# Patient Record
Sex: Female | Born: 1955 | Race: White | Hispanic: No | Smoking: Former smoker
Health system: Southern US, Community
[De-identification: ages and names within clinical notes are randomized; demographics above are authoritative.]

## PROBLEM LIST (undated history)

## (undated) DIAGNOSIS — E785 Hyperlipidemia, unspecified: Secondary | ICD-10-CM

## (undated) DIAGNOSIS — M1990 Unspecified osteoarthritis, unspecified site: Secondary | ICD-10-CM

## (undated) DIAGNOSIS — J45909 Unspecified asthma, uncomplicated: Secondary | ICD-10-CM

## (undated) DIAGNOSIS — Z8619 Personal history of other infectious and parasitic diseases: Secondary | ICD-10-CM

## (undated) DIAGNOSIS — F329 Major depressive disorder, single episode, unspecified: Secondary | ICD-10-CM

## (undated) DIAGNOSIS — I10 Essential (primary) hypertension: Secondary | ICD-10-CM

## (undated) DIAGNOSIS — K5792 Diverticulitis of intestine, part unspecified, without perforation or abscess without bleeding: Secondary | ICD-10-CM

## (undated) DIAGNOSIS — F32A Depression, unspecified: Secondary | ICD-10-CM

## (undated) DIAGNOSIS — K635 Polyp of colon: Secondary | ICD-10-CM

## (undated) DIAGNOSIS — K760 Fatty (change of) liver, not elsewhere classified: Secondary | ICD-10-CM

## (undated) DIAGNOSIS — E119 Type 2 diabetes mellitus without complications: Secondary | ICD-10-CM

## (undated) DIAGNOSIS — T7840XA Allergy, unspecified, initial encounter: Secondary | ICD-10-CM

## (undated) HISTORY — DX: Type 2 diabetes mellitus without complications: E11.9

## (undated) HISTORY — DX: Major depressive disorder, single episode, unspecified: F32.9

## (undated) HISTORY — DX: Essential (primary) hypertension: I10

## (undated) HISTORY — PX: ABDOMINAL HYSTERECTOMY: SHX81

## (undated) HISTORY — DX: Diverticulitis of intestine, part unspecified, without perforation or abscess without bleeding: K57.92

## (undated) HISTORY — DX: Polyp of colon: K63.5

## (undated) HISTORY — DX: Hyperlipidemia, unspecified: E78.5

## (undated) HISTORY — DX: Unspecified asthma, uncomplicated: J45.909

## (undated) HISTORY — PX: VAGINAL HYSTERECTOMY: SUR661

## (undated) HISTORY — PX: CATARACT EXTRACTION, BILATERAL: SHX1313

## (undated) HISTORY — DX: Depression, unspecified: F32.A

## (undated) HISTORY — PX: VAGINAL PROLAPSE REPAIR: SHX830

## (undated) HISTORY — DX: Unspecified osteoarthritis, unspecified site: M19.90

## (undated) HISTORY — DX: Fatty (change of) liver, not elsewhere classified: K76.0

## (undated) HISTORY — DX: Personal history of other infectious and parasitic diseases: Z86.19

## (undated) HISTORY — DX: Allergy, unspecified, initial encounter: T78.40XA

---

## 2010-01-20 DIAGNOSIS — K5732 Diverticulitis of large intestine without perforation or abscess without bleeding: Secondary | ICD-10-CM | POA: Insufficient documentation

## 2010-01-20 DIAGNOSIS — Z8719 Personal history of other diseases of the digestive system: Secondary | ICD-10-CM | POA: Insufficient documentation

## 2010-02-01 DIAGNOSIS — E669 Obesity, unspecified: Secondary | ICD-10-CM | POA: Insufficient documentation

## 2010-08-11 DIAGNOSIS — F341 Dysthymic disorder: Secondary | ICD-10-CM | POA: Insufficient documentation

## 2010-09-08 DIAGNOSIS — E785 Hyperlipidemia, unspecified: Secondary | ICD-10-CM | POA: Insufficient documentation

## 2013-11-05 DIAGNOSIS — M222X9 Patellofemoral disorders, unspecified knee: Secondary | ICD-10-CM | POA: Insufficient documentation

## 2013-11-05 DIAGNOSIS — M25569 Pain in unspecified knee: Secondary | ICD-10-CM | POA: Insufficient documentation

## 2014-12-01 DIAGNOSIS — J4530 Mild persistent asthma, uncomplicated: Secondary | ICD-10-CM | POA: Insufficient documentation

## 2014-12-01 DIAGNOSIS — Z87891 Personal history of nicotine dependence: Secondary | ICD-10-CM | POA: Insufficient documentation

## 2015-02-24 LAB — HM HEPATITIS C SCREENING LAB: HM Hepatitis Screen: NEGATIVE

## 2015-12-08 DIAGNOSIS — R942 Abnormal results of pulmonary function studies: Secondary | ICD-10-CM | POA: Insufficient documentation

## 2016-05-27 DIAGNOSIS — D171 Benign lipomatous neoplasm of skin and subcutaneous tissue of trunk: Secondary | ICD-10-CM | POA: Insufficient documentation

## 2016-09-02 DIAGNOSIS — Z86010 Personal history of colonic polyps: Secondary | ICD-10-CM | POA: Insufficient documentation

## 2016-12-09 DIAGNOSIS — Z9109 Other allergy status, other than to drugs and biological substances: Secondary | ICD-10-CM | POA: Insufficient documentation

## 2016-12-09 DIAGNOSIS — R0982 Postnasal drip: Secondary | ICD-10-CM | POA: Insufficient documentation

## 2018-03-29 ENCOUNTER — Encounter: Payer: Self-pay | Admitting: Family Medicine

## 2018-05-04 ENCOUNTER — Ambulatory Visit (INDEPENDENT_AMBULATORY_CARE_PROVIDER_SITE_OTHER): Payer: 59 | Admitting: Internal Medicine

## 2018-05-04 ENCOUNTER — Encounter: Payer: Self-pay | Admitting: Internal Medicine

## 2018-05-04 VITALS — BP 132/78 | HR 92 | Temp 97.6°F | Ht 62.0 in | Wt 147.2 lb

## 2018-05-04 DIAGNOSIS — J45909 Unspecified asthma, uncomplicated: Secondary | ICD-10-CM | POA: Diagnosis not present

## 2018-05-04 DIAGNOSIS — Z1231 Encounter for screening mammogram for malignant neoplasm of breast: Secondary | ICD-10-CM | POA: Diagnosis not present

## 2018-05-04 DIAGNOSIS — M1990 Unspecified osteoarthritis, unspecified site: Secondary | ICD-10-CM | POA: Diagnosis not present

## 2018-05-04 DIAGNOSIS — Z23 Encounter for immunization: Secondary | ICD-10-CM | POA: Diagnosis not present

## 2018-05-04 DIAGNOSIS — K5790 Diverticulosis of intestine, part unspecified, without perforation or abscess without bleeding: Secondary | ICD-10-CM | POA: Insufficient documentation

## 2018-05-04 DIAGNOSIS — E559 Vitamin D deficiency, unspecified: Secondary | ICD-10-CM

## 2018-05-04 DIAGNOSIS — F32A Depression, unspecified: Secondary | ICD-10-CM

## 2018-05-04 DIAGNOSIS — I10 Essential (primary) hypertension: Secondary | ICD-10-CM

## 2018-05-04 DIAGNOSIS — K5900 Constipation, unspecified: Secondary | ICD-10-CM | POA: Insufficient documentation

## 2018-05-04 DIAGNOSIS — Z1329 Encounter for screening for other suspected endocrine disorder: Secondary | ICD-10-CM

## 2018-05-04 DIAGNOSIS — E119 Type 2 diabetes mellitus without complications: Secondary | ICD-10-CM | POA: Insufficient documentation

## 2018-05-04 DIAGNOSIS — F329 Major depressive disorder, single episode, unspecified: Secondary | ICD-10-CM

## 2018-05-04 DIAGNOSIS — E1165 Type 2 diabetes mellitus with hyperglycemia: Secondary | ICD-10-CM

## 2018-05-04 NOTE — Progress Notes (Signed)
Pre visit review using our clinic review tool, if applicable. No additional management support is needed unless otherwise documented below in the visit note. 

## 2018-05-04 NOTE — Patient Instructions (Addendum)
Warm prune juice miralax or senna with colace   Tylenol  Tumeric capsules or glucosamine/chroidotin   If you are interested in pneumonia 23 vaccine in 1 month if interested on nurse visit  Also needed shingrix vacccine #2  Tdap vaccine    Constipation, Adult Constipation is when a person has fewer bowel movements in a week than normal, has difficulty having a bowel movement, or has stools that are dry, hard, or larger than normal. Constipation may be caused by an underlying condition. It may become worse with age if a person takes certain medicines and does not take in enough fluids. Follow these instructions at home: Eating and drinking   Eat foods that have a lot of fiber, such as fresh fruits and vegetables, whole grains, and beans.  Limit foods that are high in fat, low in fiber, or overly processed, such as french fries, hamburgers, cookies, candies, and soda.  Drink enough fluid to keep your urine clear or pale yellow. General instructions  Exercise regularly or as told by your health care provider.  Go to the restroom when you have the urge to go. Do not hold it in.  Take over-the-counter and prescription medicines only as told by your health care provider. These include any fiber supplements.  Practice pelvic floor retraining exercises, such as deep breathing while relaxing the lower abdomen and pelvic floor relaxation during bowel movements.  Watch your condition for any changes.  Keep all follow-up visits as told by your health care provider. This is important. Contact a health care provider if:  You have pain that gets worse.  You have a fever.  You do not have a bowel movement after 4 days.  You vomit.  You are not hungry.  You lose weight.  You are bleeding from the anus.  You have thin, pencil-like stools. Get help right away if:  You have a fever and your symptoms suddenly get worse.  You leak stool or have blood in your stool.  Your abdomen is  bloated.  You have severe pain in your abdomen.  You feel dizzy or you faint. This information is not intended to replace advice given to you by your health care provider. Make sure you discuss any questions you have with your health care provider. Document Released: 01/22/2004 Document Revised: 11/13/2015 Document Reviewed: 10/14/2015 Elsevier Interactive Patient Education  2019 [employer].

## 2018-05-04 NOTE — Progress Notes (Addendum)
Chief Complaint  Patient presents with  . Establish Care   NP 1. Asthma controlled on breo 200/25 2. Osteoarthritis hands  3. H/o diverticulitis/osis  4. H/o depression PHQ 9 score 7 on zoloft 100 mg qd  5. H/o DM 2 A1C 7.6 12/21/17 on amaryl 2 mg qd could not tolerate metformin  6. HTN on norvasc 10 mg qd hctz 25 mg losartan 100 mg qd  7. C/o constipation Constipation   Review of Systems  Constitutional: Negative for weight loss.  HENT: Negative for hearing loss.   Eyes: Negative for blurred vision.  Respiratory: Negative for shortness of breath.   Cardiovascular: Negative for chest pain.  Gastrointestinal: Positive for constipation.  Musculoskeletal:       +arthitis hands   Skin: Negative for rash.  Neurological: Negative for headaches.  Psychiatric/Behavioral: Negative for depression. The patient is not nervous/anxious.    Past Medical History:  Diagnosis Date  . Allergy   . Arthritis    hands   . Asthma   . Colon polyps   . Depression   . Diabetes mellitus without complication ([place])   . Diverticulitis   . History of chicken pox   . Hyperlipidemia   . Hypertension    Past Surgical History:  Procedure Laterality Date  . ABDOMINAL HYSTERECTOMY     DUB ovaries intact s/p bladder suspension/sling no h/o abnormal pap    Family History  Problem Relation Age of Onset  . Arthritis Mother   . Depression Mother   . Diabetes Mother   . Heart disease Mother        MI, CHF  . Hyperlipidemia Mother   . Hypertension Mother   . Diabetes Father   . Heart disease Father   . Stroke Father   . Cancer Sister        glioblastoma  . Depression Sister   . Diabetes Sister   . Hyperlipidemia Sister   . Hypertension Sister   . Mental illness Sister   . Diabetes Son        type 1  . Heart disease Maternal Grandmother   . Heart disease Maternal Grandfather    Social History   Socioeconomic History  . Marital status: Divorced    Spouse name: Not on file  . Number of  children: Not on file  . Years of education: Not on file  . Highest education level: Not on file  Occupational History  . Not on file  Social Needs  . Financial resource strain: Not on file  . Food insecurity:    Worry: Not on file    Inability: Not on file  . Transportation needs:    Medical: Not on file    Non-medical: Not on file  Tobacco Use  . Smoking status: Former Research scientist (life sciences)  . Smokeless tobacco: Never Used  . Tobacco comment: quit age 70 y.o duration 12-15 years 1 ppd   Substance and Sexual Activity  . Alcohol use: Yes  . Drug use: Not Currently  . Sexual activity: Not Currently  Lifestyle  . Physical activity:    Days per week: Not on file    Minutes per session: Not on file  . Stress: Not on file  Relationships  . Social connections:    Talks on phone: Not on file    Gets together: Not on file    Attends religious service: Not on file    Active member of club or organization: Not on file    Attends meetings  of clubs or organizations: Not on file    Relationship status: Not on file  . Intimate partner violence:    Fear of current or ex partner: Not on file    Emotionally abused: Not on file    Physically abused: Not on file    Forced sexual activity: Not on file  Other Topics Concern  . Not on file  Social History Narrative   Divorced    1 son    Associates degree, [employer] medical coding educator    Sister lives with her    No guns   Wears seat belt, safe in relationship    Former smoker quit age 84 smoked 12-15 years total 1ppd    Current Meds  Medication Sig  . albuterol (PROVENTIL HFA;VENTOLIN HFA) 108 (90 Base) MCG/ACT inhaler Inhale 2 puffs into the lungs every 6 (six) hours as needed.   Marland Kitchen amLODipine (NORVASC) 10 MG tablet Take by mouth daily.   Marland Kitchen aspirin EC 81 MG tablet Take by mouth daily.   Marland Kitchen atorvastatin (LIPITOR) 20 MG tablet TAKE 1 TABLET ONE TIME DAILY  . ibuprofen (ADVIL,MOTRIN) 200 MG tablet Take by mouth every 6 (six) hours as needed.   Marland Kitchen  losartan (COZAAR) 100 MG tablet Take by mouth daily.   . montelukast (SINGULAIR) 10 MG tablet Take 10 mg by mouth at bedtime.  . sertraline (ZOLOFT) 100 MG tablet TAKE 1 TABLET EVERY DAY  . [DISCONTINUED] fluticasone furoate-vilanterol (BREO ELLIPTA) 200-25 MCG/INH AEPB Inhale 1 puff into the lungs daily.   . [DISCONTINUED] glimepiride (AMARYL) 2 MG tablet Take 2 mg by mouth daily with breakfast.   . [DISCONTINUED] hydrochlorothiazide (HYDRODIURIL) 25 MG tablet Take by mouth daily.   . [DISCONTINUED] potassium chloride (K-DUR) 10 MEQ tablet Take by mouth daily.    Allergies  Allergen Reactions  . Amoxicillin-Pot Clavulanate Diarrhea  . Ciprofloxacin Other (See Comments)    Made her more sick  . Metformin Diarrhea   No results found for this or any previous visit (from the past 2160 hour(s)). Objective  Body mass index is 26.92 kg/m. Wt Readings from Last 3 Encounters:  05/18/18 193 lb (87.5 kg)  05/04/18 147 lb 3.2 oz (66.8 kg)   Temp Readings from Last 3 Encounters:  05/18/18 98.4 F (36.9 C) (Oral)  05/04/18 97.6 F (36.4 C) (Oral)   BP Readings from Last 3 Encounters:  05/18/18 132/78  05/04/18 132/78   Pulse Readings from Last 3 Encounters:  05/18/18 (!) 111  05/04/18 92    Physical Exam Vitals signs and nursing note reviewed.  Constitutional:      Appearance: Normal appearance. She is well-developed and well-groomed.  HENT:     Head: Normocephalic and atraumatic.     Nose: Nose normal.     Mouth/Throat:     Mouth: Mucous membranes are moist.     Pharynx: Oropharynx is clear.  Eyes:     Conjunctiva/sclera: Conjunctivae normal.     Pupils: Pupils are equal, round, and reactive to light.  Cardiovascular:     Rate and Rhythm: Normal rate and regular rhythm.     Heart sounds: Normal heart sounds.  Pulmonary:     Effort: Pulmonary effort is normal.     Breath sounds: Normal breath sounds.  Skin:    General: Skin is warm and dry.  Neurological:     General:  No focal deficit present.     Mental Status: She is alert and oriented to person, place, and time. Mental  status is at baseline.  Psychiatric:        Attention and Perception: Attention and perception normal.        Mood and Affect: Mood and affect normal.        Speech: Speech normal.        Behavior: Behavior normal. Behavior is cooperative.        Thought Content: Thought content normal.        Cognition and Memory: Cognition and memory normal.        Judgment: Judgment normal.     Assessment   1. Asthma controlled  2. Osteoarthritis hands  3. H/o diverticulitis/osis  4. H/o depression PHQ 9 score 7  5. H/o DM 2 A1C 7.6 12/21/17  6. HTN 7. HM 8. Constipation  Plan   1. On Breo daily and prn Albuterol  pfts had 01/15/18  2. Prn Voltaren gel hold for now  Tylenol, Tumeric rec or glucosamine chondroitin  3. H/o diverticulitis  4. Continue zoloft 100 mg qd  5. amaryl 2 mg 1x per day  Disc healthy diet choices and exercise  6. Cont norvac 10 mg qd, hctz 25 mg qd, losartan 100 mg qd  7.  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 Warm prune juice, prn miralax/senna with colace   Pulm [facility] Delma [facility]  [facility] dentist  Former PCP Dr. Arbutus Ped MD 12/21/17 last seen   [facility] mail order refill  [place]   Provider: Dr. Olivia Mackie McLean-Scocuzza-Internal Medicine

## 2018-05-08 ENCOUNTER — Other Ambulatory Visit (INDEPENDENT_AMBULATORY_CARE_PROVIDER_SITE_OTHER): Payer: 59

## 2018-05-08 DIAGNOSIS — Z1329 Encounter for screening for other suspected endocrine disorder: Secondary | ICD-10-CM

## 2018-05-08 DIAGNOSIS — E559 Vitamin D deficiency, unspecified: Secondary | ICD-10-CM | POA: Diagnosis not present

## 2018-05-08 DIAGNOSIS — I10 Essential (primary) hypertension: Secondary | ICD-10-CM

## 2018-05-08 DIAGNOSIS — E1165 Type 2 diabetes mellitus with hyperglycemia: Secondary | ICD-10-CM

## 2018-05-09 LAB — CBC WITH DIFFERENTIAL/PLATELET
Basophils Absolute: 0 10*3/uL (ref 0.0–0.2)
Basos: 1 %
EOS (ABSOLUTE): 0.1 10*3/uL (ref 0.0–0.4)
Eos: 1 %
Hematocrit: 42.5 % (ref 34.0–46.6)
Hemoglobin: 14.9 g/dL (ref 11.1–15.9)
Immature Grans (Abs): 0 10*3/uL (ref 0.0–0.1)
Immature Granulocytes: 0 %
LYMPHS ABS: 2 10*3/uL (ref 0.7–3.1)
Lymphs: 26 %
MCH: 31.2 pg (ref 26.6–33.0)
MCHC: 35.1 g/dL (ref 31.5–35.7)
MCV: 89 fL (ref 79–97)
MONOCYTES: 6 %
Monocytes Absolute: 0.5 10*3/uL (ref 0.1–0.9)
Neutrophils Absolute: 5 10*3/uL (ref 1.4–7.0)
Neutrophils: 66 %
Platelets: 344 10*3/uL (ref 150–450)
RBC: 4.78 x10E6/uL (ref 3.77–5.28)
RDW: 11.7 % — ABNORMAL LOW (ref 12.3–15.4)
WBC: 7.7 10*3/uL (ref 3.4–10.8)

## 2018-05-09 LAB — URINALYSIS, ROUTINE W REFLEX MICROSCOPIC
Bilirubin, UA: NEGATIVE
Glucose, UA: NEGATIVE
Ketones, UA: NEGATIVE
Nitrite, UA: NEGATIVE
Protein, UA: NEGATIVE
SPEC GRAV UA: 1.016 (ref 1.005–1.030)
Urobilinogen, Ur: 0.2 mg/dL (ref 0.2–1.0)
pH, UA: 5.5 (ref 5.0–7.5)

## 2018-05-09 LAB — MICROSCOPIC EXAMINATION
Bacteria, UA: NONE SEEN
CASTS: NONE SEEN /LPF
Epithelial Cells (non renal): NONE SEEN /hpf (ref 0–10)
RBC, UA: NONE SEEN /hpf (ref 0–2)
WBC, UA: NONE SEEN /hpf (ref 0–5)

## 2018-05-09 LAB — TSH: TSH: 1.47 u[IU]/mL (ref 0.450–4.500)

## 2018-05-09 LAB — VITAMIN D 25 HYDROXY (VIT D DEFICIENCY, FRACTURES): VIT D 25 HYDROXY: 11.4 ng/mL — AB (ref 30.0–100.0)

## 2018-05-09 LAB — COMPREHENSIVE METABOLIC PANEL
ALK PHOS: 84 IU/L (ref 39–117)
ALT: 52 IU/L — ABNORMAL HIGH (ref 0–32)
AST: 25 IU/L (ref 0–40)
Albumin/Globulin Ratio: 1.7 (ref 1.2–2.2)
Albumin: 4.6 g/dL (ref 3.6–4.8)
BUN/Creatinine Ratio: 15 (ref 12–28)
BUN: 12 mg/dL (ref 8–27)
Bilirubin Total: 1 mg/dL (ref 0.0–1.2)
CO2: 22 mmol/L (ref 20–29)
Calcium: 9.9 mg/dL (ref 8.7–10.3)
Chloride: 99 mmol/L (ref 96–106)
Creatinine, Ser: 0.78 mg/dL (ref 0.57–1.00)
GFR calc Af Amer: 94 mL/min/{1.73_m2} (ref >59)
GFR calc non Af Amer: 81 mL/min/{1.73_m2} (ref >59)
GLUCOSE: 204 mg/dL — AB (ref 65–99)
Globulin, Total: 2.7 g/dL (ref 1.5–4.5)
Potassium: 3.8 mmol/L (ref 3.5–5.2)
Sodium: 141 mmol/L (ref 134–144)
Total Protein: 7.3 g/dL (ref 6.0–8.5)

## 2018-05-09 LAB — LIPID PANEL
Chol/HDL Ratio: 3 ratio (ref 0.0–4.4)
Cholesterol, Total: 186 mg/dL (ref 100–199)
HDL: 61 mg/dL (ref >39)
LDL Calculated: 93 mg/dL (ref 0–99)
Triglycerides: 161 mg/dL — ABNORMAL HIGH (ref 0–149)
VLDL Cholesterol Cal: 32 mg/dL (ref 5–40)

## 2018-05-09 LAB — HEMOGLOBIN A1C
Est. average glucose Bld gHb Est-mCnc: 186 mg/dL
Hgb A1c MFr Bld: 8.1 % — ABNORMAL HIGH (ref 4.8–5.6)

## 2018-05-09 LAB — MICROALBUMIN / CREATININE URINE RATIO
Creatinine, Urine: 141 mg/dL
MICROALB/CREAT RATIO: 10.6 mg/g{creat} (ref 0.0–30.0)
Microalbumin, Urine: 14.9 ug/mL

## 2018-05-11 ENCOUNTER — Encounter: Payer: Self-pay | Admitting: Internal Medicine

## 2018-05-11 ENCOUNTER — Other Ambulatory Visit: Payer: Self-pay | Admitting: Internal Medicine

## 2018-05-11 DIAGNOSIS — E559 Vitamin D deficiency, unspecified: Secondary | ICD-10-CM

## 2018-05-11 MED ORDER — CHOLECALCIFEROL 1.25 MG (50000 UT) PO CAPS: 50000.0000 [IU] | ORAL_CAPSULE | ORAL | 1 refills | Status: DC

## 2018-05-15 ENCOUNTER — Telehealth: Payer: Self-pay

## 2018-05-15 NOTE — Telephone Encounter (Signed)
Copied from [place] [id number]. Topic: Appointment Scheduling - Scheduling Inquiry for Clinic >> May 15, 2018 10:18 AM Jessica Snyder, NT wrote: Reason for CRM: patient is wanting to schedule her 2nd shingle shot.

## 2018-05-15 NOTE — Telephone Encounter (Signed)
Shot scheduled [id number]

## 2018-05-16 ENCOUNTER — Other Ambulatory Visit: Payer: Self-pay | Admitting: Internal Medicine

## 2018-05-16 ENCOUNTER — Ambulatory Visit (INDEPENDENT_AMBULATORY_CARE_PROVIDER_SITE_OTHER): Payer: 59 | Admitting: *Deleted

## 2018-05-16 DIAGNOSIS — R7989 Other specified abnormal findings of blood chemistry: Secondary | ICD-10-CM

## 2018-05-16 DIAGNOSIS — R945 Abnormal results of liver function studies: Principal | ICD-10-CM

## 2018-05-16 DIAGNOSIS — Z23 Encounter for immunization: Secondary | ICD-10-CM | POA: Diagnosis not present

## 2018-05-18 ENCOUNTER — Ambulatory Visit (INDEPENDENT_AMBULATORY_CARE_PROVIDER_SITE_OTHER): Payer: 59 | Admitting: Family Medicine

## 2018-05-18 ENCOUNTER — Ambulatory Visit (INDEPENDENT_AMBULATORY_CARE_PROVIDER_SITE_OTHER)
Admission: RE | Admit: 2018-05-18 | Discharge: 2018-05-18 | Disposition: A | Discharge status: Home / Self Care | Payer: 59 | Source: Ambulatory Visit | Attending: Family Medicine | Admitting: Family Medicine

## 2018-05-18 ENCOUNTER — Encounter: Payer: Self-pay | Admitting: Family Medicine

## 2018-05-18 ENCOUNTER — Ambulatory Visit: Payer: Self-pay

## 2018-05-18 ENCOUNTER — Telehealth: Payer: Self-pay | Admitting: *Deleted

## 2018-05-18 VITALS — BP 132/78 | HR 111 | Temp 98.4°F | Ht 62.0 in | Wt 193.0 lb

## 2018-05-18 DIAGNOSIS — R1030 Lower abdominal pain, unspecified: Secondary | ICD-10-CM

## 2018-05-18 LAB — CBC WITH DIFFERENTIAL/PLATELET
Basophils Absolute: 0 10*3/uL (ref 0.0–0.2)
Basos: 0 %
EOS (ABSOLUTE): 0 10*3/uL (ref 0.0–0.4)
Eos: 0 %
Hematocrit: 41 % (ref 34.0–46.6)
Hemoglobin: 14.3 g/dL (ref 11.1–15.9)
Immature Grans (Abs): 0 10*3/uL (ref 0.0–0.1)
Immature Granulocytes: 0 %
Lymphocytes Absolute: 2.4 10*3/uL (ref 0.7–3.1)
Lymphs: 21 %
MCH: 31.3 pg (ref 26.6–33.0)
MCHC: 34.9 g/dL (ref 31.5–35.7)
MCV: 90 fL (ref 79–97)
Monocytes Absolute: 1 10*3/uL — ABNORMAL HIGH (ref 0.1–0.9)
Monocytes: 9 %
Neutrophils Absolute: 8 10*3/uL — ABNORMAL HIGH (ref 1.4–7.0)
Neutrophils: 70 %
Platelets: 338 10*3/uL (ref 150–450)
RBC: 4.57 x10E6/uL (ref 3.77–5.28)
RDW: 12 % (ref 11.7–15.4)
WBC: 11.5 10*3/uL — ABNORMAL HIGH (ref 3.4–10.8)

## 2018-05-18 LAB — POC URINALSYSI DIPSTICK (AUTOMATED)
Bilirubin, UA: NEGATIVE
Blood, UA: NEGATIVE
GLUCOSE UA: NEGATIVE
Ketones, UA: NEGATIVE
Leukocytes, UA: NEGATIVE
Nitrite, UA: NEGATIVE
Protein, UA: NEGATIVE
Spec Grav, UA: 1.01 (ref 1.010–1.025)
UROBILINOGEN UA: 0.2 U/dL
pH, UA: 7 (ref 5.0–8.0)

## 2018-05-18 LAB — COMPREHENSIVE METABOLIC PANEL
ALBUMIN: 4.4 g/dL (ref 3.6–4.8)
ALT: 49 IU/L — ABNORMAL HIGH (ref 0–32)
AST: 32 IU/L (ref 0–40)
Albumin/Globulin Ratio: 1.5 (ref 1.2–2.2)
Alkaline Phosphatase: 88 IU/L (ref 39–117)
BUN / CREAT RATIO: 13 (ref 12–28)
BUN: 10 mg/dL (ref 8–27)
Bilirubin Total: 1.3 mg/dL — ABNORMAL HIGH (ref 0.0–1.2)
CO2: 24 mmol/L (ref 20–29)
Calcium: 10 mg/dL (ref 8.7–10.3)
Chloride: 95 mmol/L — ABNORMAL LOW (ref 96–106)
Creatinine, Ser: 0.79 mg/dL (ref 0.57–1.00)
GFR calc Af Amer: 92 mL/min/{1.73_m2} (ref >59)
GFR calc non Af Amer: 80 mL/min/{1.73_m2} (ref >59)
GLUCOSE: 174 mg/dL — AB (ref 65–99)
Globulin, Total: 2.9 g/dL (ref 1.5–4.5)
Potassium: 3.6 mmol/L (ref 3.5–5.2)
Sodium: 137 mmol/L (ref 134–144)
Total Protein: 7.3 g/dL (ref 6.0–8.5)

## 2018-05-18 LAB — LIPASE: Lipase: 21 U/L (ref 14–72)

## 2018-05-18 MED ORDER — METRONIDAZOLE 500 MG PO TABS: 500.0000 mg | ORAL_TABLET | Freq: Three times a day (TID) | ORAL | 0 refills | Status: AC

## 2018-05-18 MED ORDER — SULFAMETHOXAZOLE-TRIMETHOPRIM 800-160 MG PO TABS: 1.0000 | ORAL_TABLET | Freq: Two times a day (BID) | ORAL | 0 refills | Status: AC

## 2018-05-18 NOTE — Telephone Encounter (Signed)
Pt c/o sudden onset of severe abdominal pain that woke her up in the early morning hours. Pt stated that her pain is moderate. Pt has been constipated and having mild nausea. Pt stated this morning her pain is mild and intermittent. Sitting down makes it worse but then decreases. No vomiting or fever or increased abdominal distension. Pt stated pain is similar to her previous episode of diverticulitis 6 months ago.   Care advice given and pt verbalized understanding.  No appts available at [facility]. Pt given an appointment with Dr Danise Mina at [facility] at noon today.   Reason for Disposition . Age > 60 years  Answer Assessment - Initial Assessment Questions 1. LOCATION: "Where does it hurt?"      Across entire abdomen mid to lower abdomen 2. RADIATION: "Does the pain shoot anywhere else?" (e.g., chest, back)     back 3. ONSET: "When did the pain begin?" (e.g., minutes, hours or days ago)      Middle of night woke her up 4. SUDDEN: "Gradual or sudden onset?"     sudden 5. PATTERN "Does the pain come and go, or is it constant?"    - If constant: "Is it getting better, staying the same, or worsening?"      (Note: Constant means the pain never goes away completely; most serious pain is constant and it progresses)     - If intermittent: "How long does it last?" "Do you have pain now?"     (Note: Intermittent means the pain goes away completely between bouts)     Comes and goes -1 minute- only when sits down but goes away but back still hurts 6. SEVERITY: "How bad is the pain?"  (e.g., Scale 1-10; mild, moderate, or severe)   - MILD (1-3): doesn't interfere with normal activities, abdomen soft and not tender to touch    - MODERATE (4-7): interferes with normal activities or awakens from sleep, tender to touch    - SEVERE (8-10): excruciating pain, doubled over, unable to do any normal activities      moderate 7. RECURRENT SYMPTOM: "Have you ever had this type of abdominal pain before?" If  so, ask: "When was the last time?" and "What happened that time?"      Yes not this severe-6 months ago abx for diverticulitis 8. CAUSE: "What do you think is causing the abdominal pain?"     Diverticulitis but not sure 9. RELIEVING/AGGRAVATING FACTORS: "What makes it better or worse?" (e.g., movement, antacids, bowel movement)     Worse: sitting down, when trying to go to the bathroom 10. OTHER SYMPTOMS: "Has there been any vomiting, diarrhea, constipation, or urine problems?"       Constipation, nausea 11. PREGNANCY: "Is there any chance you are pregnant?" "When was your last menstrual period?"       n/a  Protocols used: ABDOMINAL PAIN - [facility]

## 2018-05-18 NOTE — Progress Notes (Signed)
BP 132/78 (BP Location: Left Arm, Patient Position: Sitting, Cuff Size: Large)   Pulse (!) 111   Temp 98.4 F (36.9 C) (Oral)   Ht 5\' 2"  (1.575 m)   Wt 193 lb (87.5 kg)   SpO2 97%   BMI 35.30 kg/m    CC: abd pain, back pain Subjective:    Patient ID: Jessica Snyder, female    DOB: March 25, 1956, 63 y.o.   MRN: [id number]  HPI: Jessica Snyder is a 63 y.o. female presenting on 05/18/2018 for Abdominal Pain (C/o low abd pain that started yesterday morning. Had shingles vaccine 05/16/18, not sure if related. Denies any n/v/d. Pain occurs when sitting. Has not had complete BM in awhile. Says she is due to have [place] abd due to pain. H/o diverticulitis. ) and Back Pain (C/o low back pain. )   Intermittent lower abdominal "twinges" for the past week, acute worsening last night of severe suprapubic abdominal pain (now resolved), persistent lower abdominal discomfort worse when sitting down. More constipated over the last 2 days. Passing gas ok. She felt feverish yesterday (see below). Feels more thirsty. Appetite decreased. Mild nausea.   No vomiting, no blood in stool or urine.   Known h/o diverticulitis latest episode 11/2017 treated with . Unsure what she took, but states she thinks cipro/flagyl and/or augmentin has made her ill. Reviewing CareEverywhere, she received treatment for diverticulitis at [facility] 11/2017 with bactrim and flagyl 10d course.   Newly established with Dr Aundra Dubin. Latest note reviewed.   Had 2nd shingrix 05/16/2018, felt feverish, chills, nauseated - stayed in bed all day.   Upcoming abd [place] next week for mild transaminitis Last colonoscopy in the past 1-2 yrs with diverticulosis. Notes mention h/o large diverticuli in sigmoid colon.      Relevant past medical, surgical, family and social history reviewed and updated as indicated. Interim medical history since our last visit reviewed. Allergies and medications reviewed and updated. Outpatient Medications Prior to Visit  Medication  Sig Dispense Refill  . albuterol (PROVENTIL HFA;VENTOLIN HFA) 108 (90 Base) MCG/ACT inhaler Inhale 2 puffs into the lungs every 6 (six) hours as needed.     Marland Kitchen amLODipine (NORVASC) 10 MG tablet Take by mouth daily.     Marland Kitchen aspirin EC 81 MG tablet Take by mouth daily.     Marland Kitchen atorvastatin (LIPITOR) 20 MG tablet TAKE 1 TABLET ONE TIME DAILY    . Cholecalciferol 1.25 MG (50000 UT) capsule Take 1 capsule (50,000 Units total) by mouth once a week. 13 capsule 1  . fluticasone furoate-vilanterol (BREO ELLIPTA) 200-25 MCG/INH AEPB Inhale 1 puff into the lungs daily.     Marland Kitchen glimepiride (AMARYL) 2 MG tablet Take 2 mg by mouth daily with breakfast.     . hydrochlorothiazide (HYDRODIURIL) 25 MG tablet Take by mouth daily.     Marland Kitchen ibuprofen (ADVIL,MOTRIN) 200 MG tablet Take by mouth every 6 (six) hours as needed.     Marland Kitchen losartan (COZAAR) 100 MG tablet Take by mouth daily.     . montelukast (SINGULAIR) 10 MG tablet Take 10 mg by mouth at bedtime.    . potassium chloride (K-DUR) 10 MEQ tablet Take by mouth daily.     . sertraline (ZOLOFT) 100 MG tablet TAKE 1 TABLET EVERY DAY     No facility-administered medications prior to visit.      Per HPI unless specifically indicated in ROS section below Review of Systems Objective:    BP 132/78 (BP Location: Left Arm, Patient  Position: Sitting, Cuff Size: Large)   Pulse (!) 111   Temp 98.4 F (36.9 C) (Oral)   Ht 5\' 2"  (1.575 m)   Wt 193 lb (87.5 kg)   SpO2 97%   BMI 35.30 kg/m   Wt Readings from Last 3 Encounters:  05/18/18 193 lb (87.5 kg)  05/04/18 147 lb 3.2 oz (66.8 kg)    Physical Exam Vitals signs and nursing note reviewed.  Constitutional:      General: She is not in acute distress.    Appearance: Normal appearance.  HENT:     Head: Normocephalic and atraumatic.     Mouth/Throat:     Mouth: Mucous membranes are dry.     Comments: Somewhat dry mm Eyes:     Extraocular Movements: Extraocular movements intact.     Pupils: Pupils are equal, round,  and reactive to light.  Cardiovascular:     Rate and Rhythm: Normal rate and regular rhythm.     Pulses: Normal pulses.     Heart sounds: Normal heart sounds. No murmur.  Pulmonary:     Effort: Pulmonary effort is normal. No respiratory distress.     Breath sounds: Normal breath sounds. No wheezing, rhonchi or rales.  Abdominal:     General: Abdomen is flat. Bowel sounds are normal. There is no distension.     Palpations: Abdomen is soft. There is no mass.     Tenderness: There is abdominal tenderness (mild-mod) in the suprapubic area and left lower quadrant. There is no right CVA tenderness, left CVA tenderness, guarding or rebound. Negative signs include Murphy's sign.     Hernia: No hernia is present.     Comments: Bowel sounds normal to decreased  Musculoskeletal:     Right lower leg: No edema.     Left lower leg: No edema.  Skin:    Findings: No rash.  Neurological:     Mental Status: She is alert.  Psychiatric:        Mood and Affect: Mood normal.       Results for orders placed or performed in visit on 05/18/18  POCT Urinalysis Dipstick (Automated)  Result Value Ref Range   Color, UA yellow    Clarity, UA clear    Glucose, UA Negative Negative   Bilirubin, UA negative    Ketones, UA negative    Spec Grav, UA 1.010 1.010 - 1.025   Blood, UA negative    pH, UA 7.0 5.0 - 8.0   Protein, UA Negative Negative   Urobilinogen, UA 0.2 0.2 or 1.0 E.U./dL   Nitrite, UA negative    Leukocytes, UA Negative Negative   Assessment & Plan:   Problem List Items Addressed This Visit    Lower abdominal pain - Primary    Left and mid lower abdominal pain with constipation and fevers/chills 2 days ago, in h/o diverticulitis - anticipate flare of same. Check 2 view abd r/o obstruction. Check labs (CBC, CMP, lipase). Treat with bactrim DS/flagyl 10d course (pt endorses cipro and augmentin intolerances) and low fiber bland diet over next 3-5 days.  Red flags to seek ER care over weekend  reviewed.  She was tachycardic but was able to tolerate glass of water. Encouraged small sips of fluid throughout the day.  Rec f/u with PCP next week for recheck.  Discussed possible surgical eval given recurrent episodes.      Relevant Orders   POCT Urinalysis Dipstick (Automated) (Completed)   DG Abd 2 Views  Comprehensive metabolic panel   Lipase   CBC with Differential       Meds ordered this encounter  Medications  . sulfamethoxazole-trimethoprim (BACTRIM DS,SEPTRA DS) 800-160 MG tablet    Sig: Take 1 tablet by mouth 2 (two) times daily for 10 days.    Dispense:  20 tablet    Refill:  0  . metroNIDAZOLE (FLAGYL) 500 MG tablet    Sig: Take 1 tablet (500 mg total) by mouth 3 (three) times daily for 10 days.    Dispense:  30 tablet    Refill:  0   Orders Placed This Encounter  Procedures  . DG Abd 2 Views    Standing Status:   Future    Number of Occurrences:   1    Standing Expiration Date:   07/17/2019    Order Specific Question:   Reason for Exam (SYMPTOM  OR DIAGNOSIS REQUIRED)    Answer:   lower abd pain, constipation r/o obstruction    Order Specific Question:   Preferred imaging location?    Answer:   [facility]    Order Specific Question:   Radiology Contrast Protocol - do NOT remove file path    Answer:   [email or web address]  . Comprehensive metabolic panel  . Lipase  . CBC with Differential  . POCT Urinalysis Dipstick (Automated)    Patient Instructions  I do think you have diverticulitis flare - treat with antibiotics sent to pharmacy. Low fiber and bland diet over the next several days.  Xray and labs today. If fever >101, worsening abdominal pain, vomiting, please seek urgent care at ER over weekend.  Follow up with Dr Aundra Dubin next week.    Follow up plan: Return in about 5 days (around 05/23/2018), or if symptoms worsen or fail to improve, for follow up visit.  Ria Bush, MD

## 2018-05-18 NOTE — Patient Instructions (Addendum)
I do think you have diverticulitis flare - treat with antibiotics sent to pharmacy. Low fiber and bland diet over the next several days.  Xray and labs today. If fever >101, worsening abdominal pain, vomiting, please seek urgent care at ER over weekend.  Follow up with Dr Aundra Dubin next week.

## 2018-05-18 NOTE — Telephone Encounter (Signed)
Sch US abdomen asap   Tell pt people feel sick after shingles shot if not better consider urgent care this could be reaction for shingles shot   [place]

## 2018-05-18 NOTE — Telephone Encounter (Signed)
Copied from [place] [id number]. Topic: General - Other >> May 18, 2018  8:24 AM Jessica Snyder wrote: Reason for CRM: Pt called stating she was suppose to have the ultra sound of the abdomin. She hasn't scheduled yet. She is experiencing intermittent abdominal pain that started late yesterday. Pt wants to know if she should go to the ER. Pt also stated after her shingles shot, she was sick and had chills and felt nauseous and had a fever. Pt stayed in bed all day/Pt was triaged/ please advise

## 2018-05-18 NOTE — Assessment & Plan Note (Addendum)
Left and mid lower abdominal pain with constipation and fevers/chills 2 days ago, in h/o diverticulitis - anticipate flare of same. Check 2 view abd r/o obstruction. Check labs (CBC, CMP, lipase). Treat with bactrim DS/flagyl 10d course (pt endorses cipro and augmentin intolerances) and low fiber bland diet over next 3-5 days.  Red flags to seek ER care over weekend reviewed.  She was tachycardic but was able to tolerate glass of water. Encouraged small sips of fluid throughout the day.  Rec f/u with PCP next week for recheck.  Discussed possible surgical eval given recurrent episodes.

## 2018-05-22 ENCOUNTER — Telehealth: Payer: Self-pay | Admitting: Internal Medicine

## 2018-05-22 ENCOUNTER — Ambulatory Visit
Admission: RE | Admit: 2018-05-22 | Discharge: 2018-05-22 | Disposition: A | Discharge status: Home / Self Care | Payer: 59 | Source: Ambulatory Visit | Attending: Internal Medicine | Admitting: Internal Medicine

## 2018-05-22 DIAGNOSIS — R945 Abnormal results of liver function studies: Secondary | ICD-10-CM | POA: Insufficient documentation

## 2018-05-22 DIAGNOSIS — R7989 Other specified abnormal findings of blood chemistry: Secondary | ICD-10-CM

## 2018-05-22 NOTE — Telephone Encounter (Signed)
Copied from [place] [id number]. Topic: Quick Communication - Lab Results (Clinic Use ONLY) >> May 22, 2018 11:07 AM Babs Bertin, CMA wrote: Called patient to inform them of [id number] lab results. When patient returns call, triage nurse may disclose results. >> May 22, 2018 11:34 AM Bea Graff, NT wrote: Pt returning call to receive lab results.

## 2018-05-22 NOTE — Telephone Encounter (Signed)
She can keep appt in 08/2018  Cancel 05/18/18 appt if better    [place]

## 2018-05-22 NOTE — Telephone Encounter (Signed)
Phone call to pt. To review results of recent abdominal US.  See result note.    Pt. Questioned if Dr. Terese Door feels it is necessary to f/u with her, after recently being treated for diverticulitis on 05/18/18.  Stated she saw Dr. Danise Mina at that time.  Stated she is doing much better now.  Has a f/u appt. in April.  Advised will send note to PCP for recommendations on f/u.  Agrees with plan.

## 2018-05-28 ENCOUNTER — Encounter: Payer: Self-pay | Admitting: Internal Medicine

## 2018-09-04 ENCOUNTER — Ambulatory Visit (INDEPENDENT_AMBULATORY_CARE_PROVIDER_SITE_OTHER): Payer: 59 | Admitting: Internal Medicine

## 2018-09-04 DIAGNOSIS — I10 Essential (primary) hypertension: Secondary | ICD-10-CM

## 2018-09-04 DIAGNOSIS — R252 Cramp and spasm: Secondary | ICD-10-CM | POA: Diagnosis not present

## 2018-09-04 DIAGNOSIS — D72829 Elevated white blood cell count, unspecified: Secondary | ICD-10-CM | POA: Diagnosis not present

## 2018-09-04 DIAGNOSIS — E559 Vitamin D deficiency, unspecified: Secondary | ICD-10-CM | POA: Diagnosis not present

## 2018-09-04 DIAGNOSIS — E1165 Type 2 diabetes mellitus with hyperglycemia: Secondary | ICD-10-CM

## 2018-09-04 DIAGNOSIS — E876 Hypokalemia: Secondary | ICD-10-CM

## 2018-09-04 DIAGNOSIS — J45909 Unspecified asthma, uncomplicated: Secondary | ICD-10-CM

## 2018-09-04 MED ORDER — SITAGLIPTIN PHOSPHATE 50 MG PO TABS: 50.0000 mg | ORAL_TABLET | Freq: Every day | ORAL | 3 refills | Status: DC

## 2018-09-04 MED ORDER — HYDROCHLOROTHIAZIDE 25 MG PO TABS: 25.0000 mg | ORAL_TABLET | Freq: Every day | ORAL | 3 refills | Status: DC

## 2018-09-04 MED ORDER — POTASSIUM CHLORIDE ER 10 MEQ PO TBCR: 10.0000 meq | EXTENDED_RELEASE_TABLET | Freq: Every day | ORAL | 3 refills | Status: DC

## 2018-09-04 MED ORDER — GLIMEPIRIDE 2 MG PO TABS: 2.0000 mg | ORAL_TABLET | Freq: Every day | ORAL | 3 refills | Status: DC

## 2018-09-04 MED ORDER — FLUTICASONE FUROATE-VILANTEROL 200-25 MCG/INH IN AEPB: 1.0000 | INHALATION_SPRAY | Freq: Every day | RESPIRATORY_TRACT | 4 refills | Status: DC

## 2018-09-04 NOTE — Progress Notes (Signed)
Virtual Visit via Video Note doxy  I connected with Jessica Snyder   on 09/04/18 at  1:30 PM EDT by a video enabled telemedicine application and verified that I am speaking with the correct person using two identifiers.  Location patient: home Location provider:work Persons participating in the virtual visit: patient, provider  I discussed the limitations of evaluation and management by telemedicine and the availability of in person appointments. The patient expressed understanding and agreed to proceed.   HPI: 1. DM 2 A1C 8.1 04/2018 cbgs running 160-170s reports eating cookies and ice cream at times sister buys sweets and they are in the house on amaryl 2 mg qd she is walking daily  2. HTN unable to check on hctz 25 mg qd, norvasc 10 mg qd losartan 100 mg qd. Will buy new BP cuff  3. Asthma controlled needs refill of breo 200/25 no issues controlled  4. C/o leg cramps in b/l legs L>R does not drink enough water at times     ROS: See pertinent positives and negatives per HPI.  Past Medical History:  Diagnosis Date  . Allergy   . Arthritis    hands   . Asthma   . Colon polyps   . Depression   . Diabetes mellitus without complication ([place])   . Diverticulitis   . History of chicken pox   . Hyperlipidemia   . Hypertension     Past Surgical History:  Procedure Laterality Date  . ABDOMINAL HYSTERECTOMY     DUB ovaries intact s/p bladder suspension/sling no h/o abnormal pap     Family History  Problem Relation Age of Onset  . Arthritis Mother   . Depression Mother   . Diabetes Mother   . Heart disease Mother        MI, CHF  . Hyperlipidemia Mother   . Hypertension Mother   . Diabetes Father   . Heart disease Father   . Stroke Father   . Cancer Sister        glioblastoma  . Depression Sister   . Diabetes Sister   . Hyperlipidemia Sister   . Hypertension Sister   . Mental illness Sister   . Diabetes Son        type 1  . Heart disease Maternal Grandmother   .  Heart disease Maternal Grandfather     SOCIAL UJ:WJXBJ with sister    Current Outpatient Medications:  .  cetirizine (ZYRTEC) 10 MG tablet, Take 10 mg by mouth daily as needed for allergies., Disp: , Rfl:  .  albuterol (PROVENTIL HFA;VENTOLIN HFA) 108 (90 Base) MCG/ACT inhaler, Inhale 2 puffs into the lungs every 6 (six) hours as needed. , Disp: , Rfl:  .  amLODipine (NORVASC) 10 MG tablet, Take by mouth daily. , Disp: , Rfl:  .  aspirin EC 81 MG tablet, Take by mouth daily. , Disp: , Rfl:  .  atorvastatin (LIPITOR) 20 MG tablet, TAKE 1 TABLET ONE TIME DAILY, Disp: , Rfl:  .  Cholecalciferol 1.25 MG (50000 UT) capsule, Take 1 capsule (50,000 Units total) by mouth once a week., Disp: 13 capsule, Rfl: 1 .  fluticasone furoate-vilanterol (BREO ELLIPTA) 200-25 MCG/INH AEPB, Inhale 1 puff into the lungs daily. Rinse mouth with use, Disp: 180 each, Rfl: 4 .  glimepiride (AMARYL) 2 MG tablet, Take 1 tablet (2 mg total) by mouth daily with breakfast., Disp: 90 tablet, Rfl: 3 .  hydrochlorothiazide (HYDRODIURIL) 25 MG tablet, Take 1 tablet (25 mg total) by  mouth daily. In am, Disp: 90 tablet, Rfl: 3 .  ibuprofen (ADVIL,MOTRIN) 200 MG tablet, Take by mouth every 6 (six) hours as needed. , Disp: , Rfl:  .  losartan (COZAAR) 100 MG tablet, Take by mouth daily. , Disp: , Rfl:  .  montelukast (SINGULAIR) 10 MG tablet, Take 10 mg by mouth at bedtime., Disp: , Rfl:  .  potassium chloride (K-DUR) 10 MEQ tablet, Take 1 tablet (10 mEq total) by mouth daily., Disp: 90 tablet, Rfl: 3 .  sertraline (ZOLOFT) 100 MG tablet, TAKE 1 TABLET EVERY DAY, Disp: , Rfl:  .  sitaGLIPtin (JANUVIA) 50 MG tablet, Take 1 tablet (50 mg total) by mouth daily., Disp: 90 tablet, Rfl: 3  EXAM:  VITALS per patient if applicable:  GENERAL: alert, oriented, appears well and in no acute distress  HEENT: atraumatic, conjunttiva clear, no obvious abnormalities on inspection of external nose and ears  NECK: normal movements of the  head and neck  LUNGS: on inspection no signs of respiratory distress, breathing rate appears normal, no obvious gross SOB, gasping or wheezing  CV: no obvious cyanosis  MS: moves all visible extremities without noticeable abnormality  PSYCH/NEURO: pleasant and cooperative, no obvious depression or anxiety, speech and thought processing grossly intact  ASSESSMENT AND PLAN:  Discussed the following assessment and plan:  Type 2 diabetes mellitus with hyperglycemia, without long-term current use of insulin (HCC) - Plan: Comprehensive metabolic panel, Lipid panel, Hemoglobin A1c, glimepiride (AMARYL) 2 MG tablet, sitaGLIPtin (JANUVIA) 50 MG tablet Check fasting labs [facility] by 12/2018 f/u after labs  Ask about eye and foot exam in future   Leukocytosis, unspecified type - Plan: CBC w/Diff  Vitamin D deficiency - Plan: Vitamin D (25 hydroxy)-still taking weekly D3 50K IU after will rec 2000-5000 IU qd   Leg cramps - Plan: Comprehensive metabolic panel, Magnesium disc theraworx otc   Mild asthma without complication, unspecified whether persistent - Plan: fluticasone furoate-vilanterol (BREO ELLIPTA) 200-25 MCG/INH AEPB  Essential hypertension - Plan: hydrochlorothiazide (HYDRODIURIL) 25 MG tablet, norvasc 10 mg qd and losartan 100 mg qd  Will buy new BP cuff   HM Flu shot utd  rec pna 23 repeat, Tdap 2/2 shingrix  Declines MMR vx   S/p hysterectomy DUB no h/o abnormal pap no need further pap mammo neg 07/24/17 neg  - pt to call to schedule  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 The patient was provided an opportunity to ask questions and all were answered. The patient agreed with the plan and demonstrated an understanding of the instructions.   The patient was advised to call back or seek an in-person evaluation if the symptoms worsen or if the condition fails to  improve as anticipated.  Time spent 25 minutes   Delorise Jackson, MD

## 2019-01-01 ENCOUNTER — Ambulatory Visit: Payer: 59 | Admitting: Internal Medicine

## 2019-01-02 ENCOUNTER — Other Ambulatory Visit: Payer: Self-pay

## 2019-01-02 ENCOUNTER — Ambulatory Visit: Payer: 59 | Admitting: Internal Medicine

## 2019-01-02 ENCOUNTER — Ambulatory Visit
Admission: RE | Admit: 2019-01-02 | Discharge: 2019-01-02 | Disposition: A | Discharge status: Home / Self Care | Payer: 59 | Source: Ambulatory Visit | Attending: Internal Medicine | Admitting: Internal Medicine

## 2019-01-02 ENCOUNTER — Ambulatory Visit
Admission: RE | Admit: 2019-01-02 | Discharge: 2019-01-02 | Disposition: A | Discharge status: Home / Self Care | Payer: 59 | Attending: Internal Medicine | Admitting: Internal Medicine

## 2019-01-02 VITALS — BP 130/78 | HR 104 | Temp 95.3°F | Resp 16 | Wt 195.2 lb

## 2019-01-02 DIAGNOSIS — Z13818 Encounter for screening for other digestive system disorders: Secondary | ICD-10-CM

## 2019-01-02 DIAGNOSIS — M5441 Lumbago with sciatica, right side: Secondary | ICD-10-CM | POA: Diagnosis not present

## 2019-01-02 DIAGNOSIS — M25561 Pain in right knee: Secondary | ICD-10-CM

## 2019-01-02 DIAGNOSIS — I10 Essential (primary) hypertension: Secondary | ICD-10-CM

## 2019-01-02 DIAGNOSIS — E559 Vitamin D deficiency, unspecified: Secondary | ICD-10-CM

## 2019-01-02 DIAGNOSIS — E1165 Type 2 diabetes mellitus with hyperglycemia: Secondary | ICD-10-CM

## 2019-01-02 DIAGNOSIS — M25551 Pain in right hip: Secondary | ICD-10-CM | POA: Diagnosis not present

## 2019-01-02 DIAGNOSIS — Z1159 Encounter for screening for other viral diseases: Secondary | ICD-10-CM

## 2019-01-02 DIAGNOSIS — K760 Fatty (change of) liver, not elsewhere classified: Secondary | ICD-10-CM

## 2019-01-02 DIAGNOSIS — Z1329 Encounter for screening for other suspected endocrine disorder: Secondary | ICD-10-CM

## 2019-01-02 MED ORDER — PREDNISONE 20 MG PO TABS: 40.0000 mg | ORAL_TABLET | Freq: Every day | ORAL | 0 refills | Status: DC

## 2019-01-02 MED ORDER — TRAMADOL HCL 50 MG PO TABS: 50.0000 mg | ORAL_TABLET | Freq: Two times a day (BID) | ORAL | 0 refills | Status: AC | PRN

## 2019-01-02 NOTE — Patient Instructions (Signed)
Look into The Next 56 days for nutrition plan online with history of diabetes  Tramadol tablets What is this medicine? TRAMADOL (TRA ma dole) is a pain reliever. It is used to treat moderate to severe pain in adults. This medicine may be used for other purposes; ask your health care provider or pharmacist if you have questions. COMMON BRAND NAME(S): Ultram What should I tell my health care provider before I take this medicine? They need to know if you have any of these conditions:  brain tumor  depression  drug abuse or addiction  head injury  if you frequently drink alcohol containing drinks  kidney disease or trouble passing urine  liver disease  lung disease, asthma, or breathing problems  seizures or epilepsy  suicidal thoughts, plans, or attempt; a previous suicide attempt by you or a family member  an unusual or allergic reaction to tramadol, codeine, other medicines, foods, dyes, or preservatives  pregnant or trying to get pregnant  breast-feeding How should I use this medicine? Take this medicine by mouth with a full glass of water. Follow the directions on the prescription label. You can take it with or without food. If it upsets your stomach, take it with food. Do not take your medicine more often than directed. A special MedGuide will be given to you by the pharmacist with each prescription and refill. Be sure to read this information carefully each time. Talk to your pediatrician regarding the use of this medicine in children. Special care may be needed. Overdosage: If you think you have taken too much of this medicine contact a poison control center or emergency room at once. NOTE: This medicine is only for you. Do not share this medicine with others. What if I miss a dose? If you miss a dose, take it as soon as you can. If it is almost time for your next dose, take only that dose. Do not take double or extra doses. What may interact with this medicine? Do not  take this medication with any of the following medicines:  MAOIs like Carbex, Eldepryl, Marplan, Nardil, and Parnate This medicine may also interact with the following medications:  alcohol  antihistamines for allergy, cough and cold  certain medicines for anxiety or sleep  certain medicines for depression like amitriptyline, fluoxetine, sertraline  certain medicines for migraine headache like almotriptan, eletriptan, frovatriptan, naratriptan, rizatriptan, sumatriptan, zolmitriptan  certain medicines for seizures like carbamazepine, oxcarbazepine, phenobarbital, primidone  certain medicines that treat or prevent blood clots like warfarin  digoxin  furazolidone  general anesthetics like halothane, isoflurane, methoxyflurane, propofol  linezolid  local anesthetics like lidocaine, pramoxine, tetracaine  medicines that relax muscles for surgery  other narcotic medicines for pain or cough  phenothiazines like chlorpromazine, mesoridazine, prochlorperazine, thioridazine  procarbazine This list may not describe all possible interactions. Give your health care provider a list of all the medicines, herbs, non-prescription drugs, or dietary supplements you use. Also tell them if you smoke, drink alcohol, or use illegal drugs. Some items may interact with your medicine. What should I watch for while using this medicine? Tell your doctor or health care provider if your pain does not go away, if it gets worse, or if you have new or a different type of pain. You may develop tolerance to the medicine. Tolerance means that you will need a higher dose of the medicine for pain relief. Tolerance is normal and is expected if you take this medicine for a long time. This medicine may  cause serious skin reactions. They can happen weeks to months after starting the medicine. Contact your health care provider right away if you notice fevers or flu-like symptoms with a rash. The rash may be red or  purple and then turn into blisters or peeling of the skin. Or, you might notice a red rash with swelling of the face, lips or lymph nodes in your neck or under your arms. Do not suddenly stop taking your medicine because you may develop a severe reaction. Your body becomes used to the medicine. This does NOT mean you are addicted. Addiction is a behavior related to getting and using a drug for a non-medical reason. If you have pain, you have a medical reason to take pain medicine. Your doctor will tell you how much medicine to take. If your doctor wants you to stop the medicine, the dose will be slowly lowered over time to avoid any side effects. There are different types of narcotic medicines (opiates). If you take more than one type at the same time or if you are taking another medicine that also causes drowsiness, you may have more side effects. Give your health care provider a list of all medicines you use. Your doctor will tell you how much medicine to take. Do not take more medicine than directed. Call emergency for help if you have problems breathing or unusual sleepiness. You may get drowsy or dizzy. Do not drive, use machinery, or do anything that needs mental alertness until you know how this medicine affects you. Do not stand or sit up quickly, especially if you are an older patient. This reduces the risk of dizzy or fainting spells. Alcohol can increase or decrease the effects of this medicine. Avoid alcoholic drinks. You may have constipation. Try to have a bowel movement at least every 2 to 3 days. If you do not have a bowel movement for 3 days, call your doctor or health care provider. Your mouth may get dry. Chewing sugarless gum or sucking hard candy, and drinking plenty of water may help. Contact your doctor if the problem does not go away or is severe. What side effects may I notice from receiving this medicine? Side effects that you should report to your doctor or health care professional  as soon as possible:  allergic reactions like skin rash, itching or hives, swelling of the face, lips, or tongue  breathing problems  confusion  redness, blistering, peeling or loosening of the skin, including inside the mouth  seizures  signs and symptoms of low blood pressure like dizziness; feeling faint or lightheaded, falls; unusually weak or tired  trouble passing urine or change in the amount of urine Side effects that usually do not require medical attention (report to your doctor or health care professional if they continue or are bothersome):  constipation  dry mouth  nausea, vomiting  tiredness This list may not describe all possible side effects. Call your doctor for medical advice about side effects. You may report side effects to FDA at 1-800-FDA-1088. Where should I keep my medicine? Keep out of the reach of children. This medicine may cause accidental overdose and death if it taken by other adults, children, or pets. Mix any unused medicine with a substance like cat litter or coffee grounds. Then throw the medicine away in a sealed container like a sealed bag or a coffee can with a lid. Do not use the medicine after the expiration date. Store at room temperature between 15 and 30  degrees C (59 and 86 degrees F). NOTE: This sheet is a summary. It may not cover all possible information. If you have questions about this medicine, talk to your doctor, pharmacist, or health care provider.  2020 Elsevier/Gold Standard (2018-08-03 15:56:48)  Hip Pain  The hip is the joint between the upper legs and the lower pelvis. The bones, cartilage, tendons, and muscles of your hip joint support your body and allow you to move around. Hip pain can range from a minor ache to severe pain in one or both of your hips. The pain may be felt on the inside of the hip joint near the groin, or the outside near the buttocks and upper thigh. You may also have swelling or stiffness. Follow these  instructions at home: Managing pain, stiffness, and swelling  If directed, apply ice to the injured area. ? Put ice in a plastic bag. ? Place a towel between your skin and the bag. ? Leave the ice on for 20 minutes, 2-3 times a day  Sleep with a pillow between your legs on your most comfortable side.  Avoid any activities that cause pain. General instructions  Take over-the-counter and prescription medicines only as told by your health care provider.  Do any exercises as told by your health care provider.  Record the following: ? How often you have hip pain. ? The location of your pain. ? What the pain feels like. ? What makes the pain worse.  Keep all follow-up visits as told by your health care provider. This is important. Contact a health care provider if:  You cannot put weight on your leg.  Your pain or swelling continues or gets worse after one week.  It gets harder to walk.  You have a fever. Get help right away if:  You fall.  You have a sudden increase in pain and swelling in your hip.  Your hip is red or swollen or very tender to touch. Summary  Hip pain can range from a minor ache to severe pain in one or both of your hips.  The pain may be felt on the inside of the hip joint near the groin, or the outside near the buttocks and upper thigh.  Avoid any activities that cause pain.  Record how often you have hip pain, the location of the pain, what makes it worse and what it feels like. This information is not intended to replace advice given to you by your health care provider. Make sure you discuss any questions you have with your health care provider. Document Released: 10/13/2009 Document Revised: 04/07/2017 Document Reviewed: 03/28/2016 Elsevier Patient Education  2020 [employer].  Acute Knee Pain, Adult Acute knee pain is sudden and may be caused by damage, swelling, or irritation of the muscles and tissues that support your knee. The injury may  result from:  A fall.  An injury to your knee from twisting motions.  A hit to the knee.  Infection. Acute knee pain may go away on its own with time and rest. If it does not, your health care provider may order tests to find the cause of the pain. These may include:  Imaging tests, such as an X-ray, MRI, or ultrasound.  Joint aspiration. In this test, fluid is removed from the knee.  Arthroscopy. In this test, a lighted tube is inserted into the knee and an image is projected onto a TV screen.  Biopsy. In this test, a sample of tissue is removed from  the body and studied under a microscope. Follow these instructions at home: Pay attention to any changes in your symptoms. Take these actions to relieve your pain. If you have a knee sleeve or brace:   Wear the sleeve or brace as told by your health care provider. Remove it only as told by your health care provider.  Loosen the sleeve or brace if your toes tingle, become numb, or turn cold and blue.  Keep the sleeve or brace clean.  If the sleeve or brace is not waterproof: ? Do not let it get wet. ? Cover it with a watertight covering when you take a bath or shower. Activity  Rest your knee.  Do not do things that cause pain or make pain worse.  Avoid high-impact activities or exercises, such as running, jumping rope, or doing jumping jacks.  Work with a physical therapist to make a safe exercise program, as recommended by your health care provider. Do exercises as told by your physical therapist. Managing pain, stiffness, and swelling   If directed, put ice on the knee: ? Put ice in a plastic bag. ? Place a towel between your skin and the bag. ? Leave the ice on for 20 minutes, 2-3 times a day.  If directed, use an elastic bandage to put pressure (compression) on your injured knee. This may control swelling, give support, and help with discomfort. General instructions  Take over-the-counter and prescription  medicines only as told by your health care provider.  Raise (elevate) your knee above the level of your heart when you are sitting or lying down.  Sleep with a pillow under your knee.  Do not use any products that contain nicotine or tobacco, such as cigarettes, e-cigarettes, and chewing tobacco. These can delay healing. If you need help quitting, ask your health care provider.  If you are overweight, work with your health care provider and a dietitian to set a weight-loss goal that is healthy and reasonable for you. Extra weight can put pressure on your knee.  Keep all follow-up visits as told by your health care provider. This is important. Contact a health care provider if:  Your knee pain continues, changes, or gets worse.  You have a fever along with knee pain.  Your knee feels warm to the touch.  Your knee buckles or locks up. Get help right away if:  Your knee swells, and the swelling becomes worse.  You cannot move your knee.  You have severe pain in your knee. Summary  Acute knee pain can be caused by a fall, an injury, an infection, or damage, swelling, or irritation of the tissues that support your knee.  Your health care provider may perform tests to find out the cause of the pain.  Pay attention to any changes in your symptoms. Relieve your pain with rest, medicines, light activity, and use of ice.  Get help if your pain continues or becomes worse, your knee swells, or you cannot move your knee. This information is not intended to replace advice given to you by your health care provider. Make sure you discuss any questions you have with your health care provider. Document Released: 02/20/2007 Document Revised: 10/05/2017 Document Reviewed: 10/05/2017 Elsevier Patient Education  2020 [employer].

## 2019-01-02 NOTE — Progress Notes (Signed)
Chief Complaint  Patient presents with  . Leg Pain   Acute visit  1. Right leg pain x 3-4 night ago right lateral thigh and knee and not able to sleep pain is 8/10 no help with Tylenol, Advil PM helped only some. Walking and sitting hurts and right lateral knee is numb and she has swelling in right medial knee. She does also have right lower back/hip pain. Denies numbness/tingling, saddle anesthesia, leg weakness, bowel or bladder incontinence. Stepping on the gas pedal with driving makes all her sx's worse  Reviewed CT ab/pelvis 07/24/15 degenerative grade 1 anterollisthesis L4/5   2. DM 2 a1c 8.1 she reports she stopped wine and vodka since ive last seen her     Review of Systems  Constitutional: Negative for weight loss.  HENT: Negative for hearing loss.   Eyes: Negative for blurred vision.  Respiratory: Negative for shortness of breath.   Cardiovascular: Negative for chest pain.  Gastrointestinal: Negative for abdominal pain.  Musculoskeletal: Positive for back pain and joint pain.  Skin: Negative for rash.  Neurological: Negative for headaches.  Psychiatric/Behavioral: Negative for depression.   Past Medical History:  Diagnosis Date  . Allergy   . Arthritis    hands   . Asthma   . Colon polyps   . Depression   . Diabetes mellitus without complication ([place])   . Diverticulitis   . History of chicken pox   . Hyperlipidemia   . Hypertension    Past Surgical History:  Procedure Laterality Date  . ABDOMINAL HYSTERECTOMY     DUB ovaries intact s/p bladder suspension/sling no h/o abnormal pap    Family History  Problem Relation Age of Onset  . Arthritis Mother   . Depression Mother   . Diabetes Mother   . Heart disease Mother        MI, CHF  . Hyperlipidemia Mother   . Hypertension Mother   . Diabetes Father   . Heart disease Father   . Stroke Father   . Cancer Sister        glioblastoma  . Depression Sister   . Diabetes Sister   . Hyperlipidemia Sister   .  Hypertension Sister   . Mental illness Sister   . Diabetes Son        type 1  . Heart disease Maternal Grandmother   . Heart disease Maternal Grandfather    Social History   Socioeconomic History  . Marital status: Divorced    Spouse name: Not on file  . Number of children: Not on file  . Years of education: Not on file  . Highest education level: Not on file  Occupational History  . Not on file  Social Needs  . Financial resource strain: Not on file  . Food insecurity    Worry: Not on file    Inability: Not on file  . Transportation needs    Medical: Not on file    Non-medical: Not on file  Tobacco Use  . Smoking status: Former Research scientist (life sciences)  . Smokeless tobacco: Never Used  . Tobacco comment: quit age 49 y.o duration 12-15 years 1 ppd   Substance and Sexual Activity  . Alcohol use: Yes  . Drug use: Not Currently  . Sexual activity: Not Currently  Lifestyle  . Physical activity    Days per week: Not on file    Minutes per session: Not on file  . Stress: Not on file  Relationships  . Social connections  Talks on phone: Not on file    Gets together: Not on file    Attends religious service: Not on file    Active member of club or organization: Not on file    Attends meetings of clubs or organizations: Not on file    Relationship status: Not on file  . Intimate partner violence    Fear of current or ex partner: Not on file    Emotionally abused: Not on file    Physically abused: Not on file    Forced sexual activity: Not on file  Other Topics Concern  . Not on file  Social History Narrative   Divorced    1 son    Associates degree, [employer] medical coding educator    Sister lives with her    No guns   Wears seat belt, safe in relationship    Former smoker quit age 43 smoked 12-15 years total 1ppd    No outpatient medications have been marked as taking for the 01/02/19 encounter (Office Visit) with McLean-Scocuzza, Nino Glow, MD.   Allergies  Allergen Reactions  .  Amoxicillin-Pot Clavulanate Diarrhea  . Ciprofloxacin Other (See Comments)    Made her more sick  . Metformin Diarrhea   No results found for this or any previous visit (from the past 2160 hour(s)). Objective  Body mass index is 35.7 kg/m. Wt Readings from Last 3 Encounters:  01/02/19 195 lb 3.2 oz (88.5 kg)  05/18/18 193 lb (87.5 kg)  05/04/18 147 lb 3.2 oz (66.8 kg)   Temp Readings from Last 3 Encounters:  01/02/19 (!) 95.3 F (35.2 C) (Temporal)  05/18/18 98.4 F (36.9 C) (Oral)  05/04/18 97.6 F (36.4 C) (Oral)   BP Readings from Last 3 Encounters:  01/02/19 130/78  05/18/18 132/78  05/04/18 132/78   Pulse Readings from Last 3 Encounters:  01/02/19 (!) 104  05/18/18 (!) 111  05/04/18 92    Physical Exam Vitals signs and nursing note reviewed.  Constitutional:      Appearance: Normal appearance. She is well-developed and well-groomed. She is obese.  HENT:     Head: Normocephalic and atraumatic.     Comments: +mask on   Cardiovascular:     Rate and Rhythm: Regular rhythm. Tachycardia present.     Heart sounds: Normal heart sounds.  Pulmonary:     Effort: Pulmonary effort is normal.     Breath sounds: Normal breath sounds.  Musculoskeletal:     Right hip: She exhibits tenderness.     Right knee: She exhibits swelling. Tenderness found. Medial joint line and lateral joint line tenderness noted.     Lumbar back: She exhibits tenderness.       Back:       Legs:  Skin:    General: Skin is warm and dry.  Neurological:     General: No focal deficit present.     Mental Status: She is alert and oriented to person, place, and time. Mental status is at baseline.     Comments: Slowed gait   Psychiatric:        Attention and Perception: Attention and perception normal.        Mood and Affect: Mood and affect normal.        Speech: Speech normal.        Behavior: Behavior normal. Behavior is cooperative.        Cognition and Memory: Cognition and memory normal.         Judgment: Judgment  normal.     Assessment   1. Right knee, right lower back r/o sciatica/radiculopathy, right hip pain  2. DM 2 A1C 8.1  3 HM Plan   1. Xray right hip, right knee and lumbar [facility] OP imaging  Prednisone 40 mg x 1 week  Tramadol 50 bid prn  2. Check fasting labs and sch upcomign f/u  3.  Flu shot due 2020  Declines today 01/02/19 rec pna 23 repeat in future  -rec Tdap had shingrix 2/2 Declines MMR vx   S/p hysterectomy DUB no h/o abnormal pap no need further pap mammo neg 07/24/17 neg referred another today pt to call to schedule as of 01/02/19  Colonoscopy 07/07/16 tubular adenoma neg path repeat in 5 years  DEXA consider age 7 Hep C neg 02/24/15    P[facility]  [facility] dentist  Former PCP Dr. Arbutus Ped MD 12/21/17 last seen   [facility] mail order refill  [place]  Provider: Dr. Olivia Mackie McLean-Scocuzza-Internal Medicine

## 2019-01-03 ENCOUNTER — Other Ambulatory Visit: Payer: Self-pay | Admitting: Internal Medicine

## 2019-01-03 DIAGNOSIS — G8929 Other chronic pain: Secondary | ICD-10-CM

## 2019-01-23 ENCOUNTER — Other Ambulatory Visit: Payer: 59

## 2019-01-30 LAB — HM DIABETES EYE EXAM

## 2019-02-04 ENCOUNTER — Encounter: Payer: Self-pay | Admitting: Internal Medicine

## 2019-02-05 ENCOUNTER — Telehealth: Payer: Self-pay | Admitting: *Deleted

## 2019-02-05 ENCOUNTER — Telehealth: Payer: Self-pay | Admitting: Internal Medicine

## 2019-02-05 NOTE — Telephone Encounter (Signed)
Left message for patient to return call back. [facility] may give and obtain information.  

## 2019-02-05 NOTE — Telephone Encounter (Signed)
Pt has lab appt here on 02/06/19. All labs were ordered for pt to go to [facility] & not here. Please verify or change orders.   Pt is a [place] also

## 2019-02-05 NOTE — Telephone Encounter (Signed)
Pt is supposed to go to [facility] for labs they are in system she is not to come here for labs

## 2019-02-05 NOTE — Telephone Encounter (Signed)
Copied from [place] #[id number]. Topic: General - Other >> Feb 05, 2019  3:10 PM Keene Breath wrote: Reason for CRM: Patient stated that she needs orders for lab work sent to [place] she can go there for insurance purposes.  Please advise and let patient know if this is possible.  CB# [phone]

## 2019-02-06 ENCOUNTER — Other Ambulatory Visit: Payer: 59

## 2019-02-06 ENCOUNTER — Ambulatory Visit
Admission: RE | Admit: 2019-02-06 | Discharge: 2019-02-06 | Disposition: A | Discharge status: Home / Self Care | Payer: 59 | Source: Ambulatory Visit | Attending: Internal Medicine | Admitting: Internal Medicine

## 2019-02-06 DIAGNOSIS — Z1231 Encounter for screening mammogram for malignant neoplasm of breast: Secondary | ICD-10-CM

## 2019-02-06 NOTE — Telephone Encounter (Signed)
The orders have been in [facility] can see them in computer what is issue?

## 2019-02-09 LAB — COMPREHENSIVE METABOLIC PANEL
ALT: 58 IU/L — ABNORMAL HIGH (ref 0–32)
AST: 26 IU/L (ref 0–40)
Albumin/Globulin Ratio: 1.6 (ref 1.2–2.2)
Albumin: 4.4 g/dL (ref 3.8–4.8)
Alkaline Phosphatase: 97 IU/L (ref 39–117)
BUN/Creatinine Ratio: 14 (ref 12–28)
BUN: 10 mg/dL (ref 8–27)
Bilirubin Total: 1.2 mg/dL (ref 0.0–1.2)
CO2: 23 mmol/L (ref 20–29)
Calcium: 9.6 mg/dL (ref 8.7–10.3)
Chloride: 99 mmol/L (ref 96–106)
Creatinine, Ser: 0.72 mg/dL (ref 0.57–1.00)
GFR calc Af Amer: 102 mL/min/{1.73_m2} (ref >59)
GFR calc non Af Amer: 89 mL/min/{1.73_m2} (ref >59)
Globulin, Total: 2.7 g/dL (ref 1.5–4.5)
Glucose: 330 mg/dL — ABNORMAL HIGH (ref 65–99)
Potassium: 4.2 mmol/L (ref 3.5–5.2)
Sodium: 136 mmol/L (ref 134–144)
Total Protein: 7.1 g/dL (ref 6.0–8.5)

## 2019-02-09 LAB — HEPATITIS B SURFACE ANTIBODY, QUANTITATIVE: Hepatitis B Surf Ab Quant: <3.1 m[IU]/mL — ABNORMAL LOW (ref >9.9)

## 2019-02-09 LAB — CBC WITH DIFFERENTIAL/PLATELET
Basophils Absolute: 0.1 10*3/uL (ref 0.0–0.2)
Basos: 1 %
EOS (ABSOLUTE): 0.1 10*3/uL (ref 0.0–0.4)
Eos: 1 %
Hematocrit: 45.4 % (ref 34.0–46.6)
Hemoglobin: 15.3 g/dL (ref 11.1–15.9)
Immature Grans (Abs): 0 10*3/uL (ref 0.0–0.1)
Immature Granulocytes: 0 %
Lymphocytes Absolute: 2.1 10*3/uL (ref 0.7–3.1)
Lymphs: 28 %
MCH: 31.7 pg (ref 26.6–33.0)
MCHC: 33.7 g/dL (ref 31.5–35.7)
MCV: 94 fL (ref 79–97)
Monocytes Absolute: 0.5 10*3/uL (ref 0.1–0.9)
Monocytes: 7 %
Neutrophils Absolute: 4.8 10*3/uL (ref 1.4–7.0)
Neutrophils: 63 %
Platelets: 299 10*3/uL (ref 150–450)
RBC: 4.83 x10E6/uL (ref 3.77–5.28)
RDW: 11.6 % — ABNORMAL LOW (ref 11.7–15.4)
WBC: 7.6 10*3/uL (ref 3.4–10.8)

## 2019-02-09 LAB — LIPID PANEL
Chol/HDL Ratio: 3.2 ratio (ref 0.0–4.4)
Cholesterol, Total: 174 mg/dL (ref 100–199)
HDL: 54 mg/dL (ref >39)
LDL Chol Calc (NIH): 94 mg/dL (ref 0–99)
Triglycerides: 151 mg/dL — ABNORMAL HIGH (ref 0–149)
VLDL Cholesterol Cal: 26 mg/dL (ref 5–40)

## 2019-02-09 LAB — MICROALBUMIN / CREATININE URINE RATIO
Creatinine, Urine: 85.3 mg/dL
Microalb/Creat Ratio: 9 mg/g creat (ref 0–29)
Microalbumin, Urine: 7.3 ug/mL

## 2019-02-09 LAB — URINALYSIS, ROUTINE W REFLEX MICROSCOPIC
Bilirubin, UA: NEGATIVE
Ketones, UA: NEGATIVE
Leukocytes,UA: NEGATIVE
Nitrite, UA: NEGATIVE
Protein,UA: NEGATIVE
RBC, UA: NEGATIVE
Specific Gravity, UA: 1.022 (ref 1.005–1.030)
Urobilinogen, Ur: 0.2 mg/dL (ref 0.2–1.0)
pH, UA: 6 (ref 5.0–7.5)

## 2019-02-09 LAB — HEPATITIS B SURFACE ANTIGEN: Hepatitis B Surface Ag: NEGATIVE

## 2019-02-09 LAB — HEMOGLOBIN A1C
Est. average glucose Bld gHb Est-mCnc: 275 mg/dL
Hgb A1c MFr Bld: 11.2 % — ABNORMAL HIGH (ref 4.8–5.6)

## 2019-02-09 LAB — VITAMIN D 25 HYDROXY (VIT D DEFICIENCY, FRACTURES): Vit D, 25-Hydroxy: 44.3 ng/mL (ref 30.0–100.0)

## 2019-02-09 LAB — TSH: TSH: 1.35 u[IU]/mL (ref 0.450–4.500)

## 2019-02-09 LAB — HEPATITIS A ANTIBODY, TOTAL: hep A Total Ab: NEGATIVE

## 2019-02-11 ENCOUNTER — Other Ambulatory Visit: Payer: Self-pay

## 2019-02-13 ENCOUNTER — Ambulatory Visit: Payer: 59 | Admitting: Internal Medicine

## 2019-02-13 ENCOUNTER — Other Ambulatory Visit: Payer: Self-pay

## 2019-02-13 ENCOUNTER — Encounter: Payer: Self-pay | Admitting: Internal Medicine

## 2019-02-13 VITALS — BP 132/76 | HR 96 | Temp 97.3°F | Ht 62.0 in | Wt 189.4 lb

## 2019-02-13 DIAGNOSIS — H269 Unspecified cataract: Secondary | ICD-10-CM | POA: Diagnosis not present

## 2019-02-13 DIAGNOSIS — E785 Hyperlipidemia, unspecified: Secondary | ICD-10-CM | POA: Diagnosis not present

## 2019-02-13 DIAGNOSIS — E1165 Type 2 diabetes mellitus with hyperglycemia: Secondary | ICD-10-CM | POA: Diagnosis not present

## 2019-02-13 DIAGNOSIS — K760 Fatty (change of) liver, not elsewhere classified: Secondary | ICD-10-CM

## 2019-02-13 DIAGNOSIS — M79671 Pain in right foot: Secondary | ICD-10-CM

## 2019-02-13 DIAGNOSIS — Z23 Encounter for immunization: Secondary | ICD-10-CM | POA: Diagnosis not present

## 2019-02-13 DIAGNOSIS — R7989 Other specified abnormal findings of blood chemistry: Secondary | ICD-10-CM

## 2019-02-13 MED ORDER — SITAGLIPTIN PHOSPHATE 100 MG PO TABS: 100.0000 mg | ORAL_TABLET | Freq: Every day | ORAL | 3 refills | Status: DC

## 2019-02-13 NOTE — Progress Notes (Signed)
Chief Complaint  Patient presents with  . Follow-up   F/u  1. Dm 2 uncontrolled A1c 11.2 up from 8.1 was on victoza years ago now [place] 50 mg recently increased to 100 mg qd amaryl 2 mg qd she is still not doing etoh but has not been eating properly. She also started Weight watcher sdiet. cbg after lunch was 291  2. Cataracts pending eye f/u for surgery upcoming  3. Right medial great toe/foot pain after vacuum head fell of foot iced it but pain is better but still sore     Review of Systems  Constitutional: Negative for weight loss.  HENT: Negative for hearing loss.   Eyes: Negative for blurred vision.  Respiratory: Negative for shortness of breath.   Cardiovascular: Negative for chest pain.  Gastrointestinal: Negative for abdominal pain.  Musculoskeletal: Positive for joint pain.  Skin: Negative for rash.  Neurological: Negative for headaches.  Psychiatric/Behavioral: Negative for depression.   Past Medical History:  Diagnosis Date  . Allergy   . Arthritis    hands   . Asthma   . Colon polyps   . Depression   . Diabetes mellitus without complication ([place])   . Diverticulitis   . History of chicken pox   . Hyperlipidemia   . Hypertension    Past Surgical History:  Procedure Laterality Date  . ABDOMINAL HYSTERECTOMY     DUB ovaries intact s/p bladder suspension/sling no h/o abnormal pap    Family History  Problem Relation Age of Onset  . Arthritis Mother   . Depression Mother   . Diabetes Mother   . Heart disease Mother        MI, CHF  . Hyperlipidemia Mother   . Hypertension Mother   . Diabetes Father   . Heart disease Father   . Stroke Father   . Cancer Sister        glioblastoma  . Depression Sister   . Diabetes Sister   . Hyperlipidemia Sister   . Hypertension Sister   . Mental illness Sister   . Diabetes Son        type 1  . Heart disease Maternal Grandmother   . Heart disease Maternal Grandfather    Social History   Socioeconomic History  .  Marital status: Divorced    Spouse name: Not on file  . Number of children: Not on file  . Years of education: Not on file  . Highest education level: Not on file  Occupational History  . Not on file  Social Needs  . Financial resource strain: Not on file  . Food insecurity    Worry: Not on file    Inability: Not on file  . Transportation needs    Medical: Not on file    Non-medical: Not on file  Tobacco Use  . Smoking status: Former Research scientist (life sciences)  . Smokeless tobacco: Never Used  . Tobacco comment: quit age 35 y.o duration 12-15 years 1 ppd   Substance and Sexual Activity  . Alcohol use: Yes  . Drug use: Not Currently  . Sexual activity: Not Currently  Lifestyle  . Physical activity    Days per week: Not on file    Minutes per session: Not on file  . Stress: Not on file  Relationships  . Social Herbalist on phone: Not on file    Gets together: Not on file    Attends religious service: Not on file    Active member of  club or organization: Not on file    Attends meetings of clubs or organizations: Not on file    Relationship status: Not on file  . Intimate partner violence    Fear of current or ex partner: Not on file    Emotionally abused: Not on file    Physically abused: Not on file    Forced sexual activity: Not on file  Other Topics Concern  . Not on file  Social History Narrative   Divorced    1 son    Associates degree, [employer] medical coding educator    Sister lives with her    No guns   Wears seat belt, safe in relationship    Former smoker quit age 3 smoked 12-15 years total 1ppd    Current Meds  Medication Sig  . albuterol (PROVENTIL HFA;VENTOLIN HFA) 108 (90 Base) MCG/ACT inhaler Inhale 2 puffs into the lungs every 6 (six) hours as needed.   Marland Kitchen amLODipine (NORVASC) 10 MG tablet Take by mouth daily.   Marland Kitchen aspirin EC 81 MG tablet Take by mouth daily.   Marland Kitchen atorvastatin (LIPITOR) 20 MG tablet TAKE 1 TABLET ONE TIME DAILY  . cetirizine (ZYRTEC) 10 MG  tablet Take 10 mg by mouth daily as needed for allergies.  . Cholecalciferol 1.25 MG (50000 UT) capsule Take 1 capsule (50,000 Units total) by mouth once a week.  . fluticasone furoate-vilanterol (BREO ELLIPTA) 200-25 MCG/INH AEPB Inhale 1 puff into the lungs daily. Rinse mouth with use  . glimepiride (AMARYL) 2 MG tablet Take 1 tablet (2 mg total) by mouth daily with breakfast.  . hydrochlorothiazide (HYDRODIURIL) 25 MG tablet Take 1 tablet (25 mg total) by mouth daily. In am  . ibuprofen (ADVIL,MOTRIN) 200 MG tablet Take by mouth every 6 (six) hours as needed.   Marland Kitchen losartan (COZAAR) 100 MG tablet Take by mouth daily.   . montelukast (SINGULAIR) 10 MG tablet Take 10 mg by mouth at bedtime.  . potassium chloride (K-DUR) 10 MEQ tablet Take 1 tablet (10 mEq total) by mouth daily.  . sertraline (ZOLOFT) 100 MG tablet TAKE 1 TABLET EVERY DAY  . sitaGLIPtin (JANUVIA) 100 MG tablet Take 1 tablet (100 mg total) by mouth daily.  . [DISCONTINUED] predniSONE (DELTASONE) 20 MG tablet Take 2 tablets (40 mg total) by mouth daily with breakfast.  . [DISCONTINUED] sitaGLIPtin (JANUVIA) 50 MG tablet Take 1 tablet (50 mg total) by mouth daily.   Allergies  Allergen Reactions  . Amoxicillin-Pot Clavulanate Diarrhea  . Ciprofloxacin Other (See Comments)    Made her more sick  . Metformin Diarrhea   Recent Results (from the past 2160 hour(s))  HM DIABETES EYE EXAM     Status: None   Collection Time: 01/30/19 12:00 AM  Result Value Ref Range   HM Diabetic Eye Exam No Retinopathy No Retinopathy    Comment: neg retinopathy or edema Dr. Nancie Neas O.D with Dr. Jomarie Longs   Comprehensive metabolic panel     Status: Abnormal   Collection Time: 02/08/19  8:06 AM  Result Value Ref Range   Glucose 330 (H) 65 - 99 mg/dL   BUN 10 8 - 27 mg/dL   Creatinine, Ser 0.72 0.57 - 1.00 mg/dL   GFR calc non Af Amer 89 >59 mL/min/1.73   GFR calc Af Amer 102 >59 mL/min/1.73   BUN/Creatinine Ratio 14 12 - 28   Sodium 136 134 -  144 mmol/L   Potassium 4.2 3.5 - 5.2 mmol/L   Chloride 99  96 - 106 mmol/L   CO2 23 20 - 29 mmol/L   Calcium 9.6 8.7 - 10.3 mg/dL   Total Protein 7.1 6.0 - 8.5 g/dL   Albumin 4.4 3.8 - 4.8 g/dL   Globulin, Total 2.7 1.5 - 4.5 g/dL   Albumin/Globulin Ratio 1.6 1.2 - 2.2   Bilirubin Total 1.2 0.0 - 1.2 mg/dL   Alkaline Phosphatase 97 39 - 117 IU/L   AST 26 0 - 40 IU/L   ALT 58 (H) 0 - 32 IU/L  CBC w/Diff     Status: Abnormal   Collection Time: 02/08/19  8:06 AM  Result Value Ref Range   WBC 7.6 3.4 - 10.8 x10E3/uL   RBC 4.83 3.77 - 5.28 x10E6/uL   Hemoglobin 15.3 11.1 - 15.9 g/dL   Hematocrit 45.4 34.0 - 46.6 %   MCV 94 79 - 97 fL   MCH 31.7 26.6 - 33.0 pg   MCHC 33.7 31.5 - 35.7 g/dL   RDW 11.6 (L) 11.7 - 15.4 %   Platelets 299 150 - 450 x10E3/uL   Neutrophils 63 Not Estab. %   Lymphs 28 Not Estab. %   Monocytes 7 Not Estab. %   Eos 1 Not Estab. %   Basos 1 Not Estab. %   Neutrophils Absolute 4.8 1.4 - 7.0 x10E3/uL   Lymphocytes Absolute 2.1 0.7 - 3.1 x10E3/uL   Monocytes Absolute 0.5 0.1 - 0.9 x10E3/uL   EOS (ABSOLUTE) 0.1 0.0 - 0.4 x10E3/uL   Basophils Absolute 0.1 0.0 - 0.2 x10E3/uL   Immature Granulocytes 0 Not Estab. %   Immature Grans (Abs) 0.0 0.0 - 0.1 x10E3/uL  Lipid panel     Status: Abnormal   Collection Time: 02/08/19  8:06 AM  Result Value Ref Range   Cholesterol, Total 174 100 - 199 mg/dL   Triglycerides 151 (H) 0 - 149 mg/dL   HDL 54 >39 mg/dL   VLDL Cholesterol Cal 26 5 - 40 mg/dL   LDL Chol Calc (NIH) 94 0 - 99 mg/dL   Chol/HDL Ratio 3.2 0.0 - 4.4 ratio    Comment:                                   T. Chol/HDL Ratio                                             Men  Women                               1/2 Avg.Risk  3.4    3.3                                   Avg.Risk  5.0    4.4                                2X Avg.Risk  9.6    7.1                                3X Avg.Risk 23.4  11.0   Hemoglobin A1c     Status: Abnormal   Collection Time:  02/08/19  8:06 AM  Result Value Ref Range   Hgb A1c MFr Bld 11.2 (H) 4.8 - 5.6 %    Comment:          Prediabetes: 5.7 - 6.4          Diabetes: >6.4          Glycemic control for adults with diabetes: <7.0    Est. average glucose Bld gHb Est-mCnc 275 mg/dL  TSH     Status: None   Collection Time: 02/08/19  8:06 AM  Result Value Ref Range   TSH 1.350 0.450 - 4.500 uIU/mL  Urinalysis, Routine w reflex microscopic     Status: Abnormal   Collection Time: 02/08/19  8:06 AM  Result Value Ref Range   Specific Gravity, UA 1.022 1.005 - 1.030   pH, UA 6.0 5.0 - 7.5   Color, UA Yellow Yellow   Appearance Ur Clear Clear   Leukocytes,UA Negative Negative   Protein,UA Negative Negative/Trace   Glucose, UA 3+ (A) Negative   Ketones, UA Negative Negative   RBC, UA Negative Negative   Bilirubin, UA Negative Negative   Urobilinogen, Ur 0.2 0.2 - 1.0 mg/dL   Nitrite, UA Negative Negative   Microscopic Examination Comment     Comment: Microscopic not indicated and not performed.  Microalbumin / creatinine urine ratio     Status: None   Collection Time: 02/08/19  8:06 AM  Result Value Ref Range   Creatinine, Urine 85.3 Not Estab. mg/dL   Microalbumin, Urine 7.3 Not Estab. ug/mL   Microalb/Creat Ratio 9 0 - 29 mg/g creat    Comment:                        Normal:                0 -  29                        Moderately increased: 30 - 300                        Severely increased:       >300               **Please note reference interval change**   Vitamin D (25 hydroxy)     Status: None   Collection Time: 02/08/19  8:06 AM  Result Value Ref Range   Vit D, 25-Hydroxy 44.3 30.0 - 100.0 ng/mL    Comment: Vitamin D deficiency has been defined by the [place] and an Endocrine Society practice guideline as a level of serum 25-OH vitamin D less than 20 ng/mL (1,2). The Endocrine Society went on to further define vitamin D insufficiency as a level between 21 and 29 ng/mL (2). 1. IOM  (Institute of Medicine). 2010. Dietary reference    intakes for calcium and D. [place]: The    [employer]. 2. Holick MF, Binkley [place], Bischoff-Ferrari HA, et al.    Evaluation, treatment, and prevention of vitamin D    deficiency: an Endocrine Society clinical practice    guideline. JCEM. 2011 Jul; 96(7):1911-30.   Hepatitis B surface antibody,quantitative     Status: Abnormal   Collection Time: 02/08/19  8:06 AM  Result Value Ref Range   Hepatitis B Surf Ab  Quant <3.1 (L) Immunity>9.9 mIU/mL    Comment:   Status of Immunity                     Anti-HBs Level   ------------------                     -------------- Inconsistent with Immunity                   0.0 - 9.9 Consistent with Immunity                          >9.9   Hepatitis A Ab, Total     Status: None   Collection Time: 02/08/19  8:06 AM  Result Value Ref Range   hep A Total Ab Negative Negative  Hepatitis B surface antigen     Status: None   Collection Time: 02/08/19  8:06 AM  Result Value Ref Range   Hepatitis B Surface Ag Negative Negative   Objective  Body mass index is 34.64 kg/m. Wt Readings from Last 3 Encounters:  02/13/19 189 lb 6.4 oz (85.9 kg)  01/02/19 195 lb 3.2 oz (88.5 kg)  05/18/18 193 lb (87.5 kg)   Temp Readings from Last 3 Encounters:  02/13/19 (!) 97.3 F (36.3 C) (Oral)  01/02/19 (!) 95.3 F (35.2 C) (Temporal)  05/18/18 98.4 F (36.9 C) (Oral)   BP Readings from Last 3 Encounters:  02/13/19 132/76  01/02/19 130/78  05/18/18 132/78   Pulse Readings from Last 3 Encounters:  02/13/19 96  01/02/19 (!) 104  05/18/18 (!) 111    Physical Exam Vitals signs and nursing note reviewed.  Constitutional:      Appearance: Normal appearance. She is well-developed and well-groomed. She is obese.     Comments: +mask on    HENT:     Head: Normocephalic and atraumatic.  Eyes:     Conjunctiva/sclera: Conjunctivae normal.     Pupils: Pupils are equal, round, and reactive to  light.  Cardiovascular:     Rate and Rhythm: Normal rate and regular rhythm.     Heart sounds: Normal heart sounds. No murmur.  Pulmonary:     Effort: Pulmonary effort is normal.     Breath sounds: Normal breath sounds.  Skin:    General: Skin is warm and dry.  Neurological:     General: No focal deficit present.     Mental Status: She is alert and oriented to person, place, and time. Mental status is at baseline.     Gait: Gait normal.  Psychiatric:        Attention and Perception: Attention and perception normal.        Mood and Affect: Mood and affect normal.        Speech: Speech normal.        Behavior: Behavior normal. Behavior is cooperative.        Thought Content: Thought content normal.        Cognition and Memory: Cognition and memory normal.        Judgment: Judgment normal.     Assessment  Plan  Type 2 diabetes mellitus with hyperglycemia 11.2 uncontrolled - Plan: sitaGLIPtin (JANUVIA) 100 MG tablet qd to [facility]a  Cont amaryl 2 mg qd  Given sample ozempic x1 0.25 1x per week x 6 weeks  Call back not sure if insurance will cover ozempic vs trulicity pt to check and see  Eye exam up to date has cataracts  Foot exam normal today  Urine utd  On ARB and statin  Declines nutrition for now wants to check to see cost. Also disc The next 56 days   Cataract of both eyes, unspecified cataract type  Pending surgery upcoming   Fatty liver and elevated lfts  Consider MRI abdomen vs CT ab in future pt wants to wait for now  further w/u   Right medial great toe pain s/p trauma RICE   HM Flu shot given today -rec pna 23 repeat in future will get prevnar 1st in 07/2019  -rec twinrix x 3 doses -rec Tdap  had shingrix 2/2 Declines MMR vx   S/p hysterectomy DUB no h/o abnormal pap no need further pap mammo neg 02/06/19  Colonoscopy 07/07/16 tubular adenoma neg path repeat in 5 years  DEXA consider age 63 Hep C neg 02/24/15  Skin no current issues reassess each visit    [facility] [place]  [facility] dentist  Former PCP Dr. Arbutus Ped MD 12/21/17 last seen   [facility] mail order refill  [place]  Provider: Dr. Olivia Mackie McLean-Scocuzza-Internal Medicine

## 2019-02-13 NOTE — Patient Instructions (Addendum)
Blood sugar in am fasting no food 90-<130  2 hours after food < 180  The next 56 days online  [facility]   Vitamin D3 4000 Iu daily    Check [employer] for preferred either ozempic or trulicity   Consider twinrix vaccine hep A/B in 1 month from now, then in 2 months from now, then in 7 months from now on nurse   Consider prevnar 65 and pneumonia 1 year later  Semaglutide injection solution/Ozempic 0.25 1x per week  What is this medicine? SEMAGLUTIDE (Sem a GLOO tide) is used to improve blood sugar control in adults with type 2 diabetes. This medicine may be used with other diabetes medicines. This drug may also reduce the risk of heart attack or stroke if you have type 2 diabetes and risk factors for heart disease. This medicine may be used for other purposes; ask your health care provider or pharmacist if you have questions. COMMON BRAND NAME(S): OZEMPIC What should I tell my health care provider before I take this medicine? They need to know if you have any of these conditions:  endocrine tumors (MEN 2) or if someone in your family had these tumors  eye disease, vision problems  history of pancreatitis  kidney disease  stomach problems  thyroid cancer or if someone in your family had thyroid cancer  an unusual or allergic reaction to semaglutide, other medicines, foods, dyes, or preservatives  pregnant or trying to get pregnant  breast-feeding How should I use this medicine? This medicine is for injection under the skin of your upper leg (thigh), stomach area, or upper arm. It is given once every week (every 7 days). You will be taught how to prepare and give this medicine. Use exactly as directed. Take your medicine at regular intervals. Do not take it more often than directed. If you use this medicine with insulin, you should inject this medicine and the insulin separately. Do not mix them together. Do not give the injections right next to each other.  Change (rotate) injection sites with each injection. It is important that you put your used needles and syringes in a special sharps container. Do not put them in a trash can. If you do not have a sharps container, call your pharmacist or healthcare provider to get one. A special MedGuide will be given to you by the pharmacist with each prescription and refill. Be sure to read this information carefully each time. Talk to your pediatrician regarding the use of this medicine in children. Special care may be needed. Overdosage: If you think you have taken too much of this medicine contact a poison control center or emergency room at once. NOTE: This medicine is only for you. Do not share this medicine with others. What if I miss a dose? If you miss a dose, take it as soon as you can within 5 days after the missed dose. Then take your next dose at your regular weekly time. If it has been longer than 5 days after the missed dose, do not take the missed dose. Take the next dose at your regular time. Do not take double or extra doses. If you have questions about a missed dose, contact your health care provider for advice. What may interact with this medicine?  other medicines for diabetes Many medications may cause changes in blood sugar, these include:  alcohol containing beverages  antiviral medicines for HIV or AIDS  aspirin and aspirin-like drugs  certain medicines for blood pressure,  heart disease, irregular heart beat  chromium  diuretics  female hormones, such as estrogens or progestins, birth control pills  fenofibrate  gemfibrozil  isoniazid  lanreotide  female hormones or anabolic steroids  MAOIs like Carbex, Eldepryl, Marplan, Nardil, and Parnate  medicines for weight loss  medicines for allergies, asthma, cold, or cough  medicines for depression, anxiety, or psychotic disturbances  niacin  nicotine  NSAIDs, medicines for pain and inflammation, like ibuprofen or  naproxen  octreotide  pasireotide  pentamidine  phenytoin  probenecid  quinolone antibiotics such as ciprofloxacin, levofloxacin, ofloxacin  some herbal dietary supplements  steroid medicines such as prednisone or cortisone  sulfamethoxazole; trimethoprim  thyroid hormones Some medications can hide the warning symptoms of low blood sugar (hypoglycemia). You may need to monitor your blood sugar more closely if you are taking one of these medications. These include:  beta-blockers, often used for high blood pressure or heart problems (examples include atenolol, metoprolol, propranolol)  clonidine  guanethidine  reserpine This list may not describe all possible interactions. Give your health care provider a list of all the medicines, herbs, non-prescription drugs, or dietary supplements you use. Also tell them if you smoke, drink alcohol, or use illegal drugs. Some items may interact with your medicine. What should I watch for while using this medicine? Visit your doctor or health care professional for regular checks on your progress. Drink plenty of fluids while taking this medicine. Check with your doctor or health care professional if you get an attack of severe diarrhea, nausea, and vomiting. The loss of too much body fluid can make it dangerous for you to take this medicine. A test called the HbA1C (A1C) will be monitored. This is a simple blood test. It measures your blood sugar control over the last 2 to 3 months. You will receive this test every 3 to 6 months. Learn how to check your blood sugar. Learn the symptoms of low and high blood sugar and how to manage them. Always carry a quick-source of sugar with you in case you have symptoms of low blood sugar. Examples include hard sugar candy or glucose tablets. Make sure others know that you can choke if you eat or drink when you develop serious symptoms of low blood sugar, such as seizures or unconsciousness. They must get  medical help at once. Tell your doctor or health care professional if you have high blood sugar. You might need to change the dose of your medicine. If you are sick or exercising more than usual, you might need to change the dose of your medicine. Do not skip meals. Ask your doctor or health care professional if you should avoid alcohol. Many nonprescription cough and cold products contain sugar or alcohol. These can affect blood sugar. Pens should never be shared. Even if the needle is changed, sharing may result in passing of viruses like hepatitis or HIV. Wear a medical ID bracelet or chain, and carry a card that describes your disease and details of your medicine and dosage times. Do not become pregnant while taking this medicine. Women should inform their doctor if they wish to become pregnant or think they might be pregnant. There is a potential for serious side effects to an unborn child. Talk to your health care professional or pharmacist for more information. What side effects may I notice from receiving this medicine? Side effects that you should report to your doctor or health care professional as soon as possible:  allergic reactions like skin  rash, itching or hives, swelling of the face, lips, or tongue  breathing problems  changes in vision  diarrhea that continues or is severe  lump or swelling on the neck  severe nausea  signs and symptoms of infection like fever or chills; cough; sore throat; pain or trouble passing urine  signs and symptoms of low blood sugar such as feeling anxious, confusion, dizziness, increased hunger, unusually weak or tired, sweating, shakiness, cold, irritable, headache, blurred vision, fast heartbeat, loss of consciousness  signs and symptoms of kidney injury like trouble passing urine or change in the amount of urine  trouble swallowing  unusual stomach upset or pain  vomiting Side effects that usually do not require medical attention  (report to your doctor or health care professional if they continue or are bothersome):  constipation  diarrhea  nausea  pain, redness, or irritation at site where injected  stomach upset This list may not describe all possible side effects. Call your doctor for medical advice about side effects. You may report side effects to FDA at 1-800-FDA-1088. Where should I keep my medicine? Keep out of the reach of children. Store unopened pens in a refrigerator between 2 and 8 degrees C (36 and 46 degrees F). Do not freeze. Protect from light and heat. After you first use the pen, it can be stored for 56 days at room temperature between 15 and 30 degrees C (59 and 86 degrees F) or in a refrigerator. Throw away your used pen after 56 days or after the expiration date, whichever comes first. Do not store your pen with the needle attached. If the needle is left on, medicine may leak from the pen. NOTE: This sheet is a summary. It may not cover all possible information. If you have questions about this medicine, talk to your doctor, pharmacist, or health care provider.  2020 Elsevier/Gold Standard (2018-05-25 14:25:32)   Hepatitis A; Hepatitis B Vaccine injection What is this medicine? HEPATITIS A VACCINE; HEPATITIS B VACCINE (hep uh TAHY tis A vak SEEN; hep uh TAHY tis B vak SEEN) is a vaccine to protect from an infection with the hepatitis A and B virus. This vaccine does not contain the live viruses. It will not cause a hepatitis infection. This medicine may be used for other purposes; ask your health care provider or pharmacist if you have questions. COMMON BRAND NAME(S): Twinrix What should I tell my health care provider before I take this medicine? They need to know if you have any of these conditions:  bleeding disorder  fever or infection  heart disease  immune system problems  an unusual or allergic reaction to hepatitis A or B vaccine, neomycin, yeast, thimerosal, other medicines,  foods, dyes, or preservatives  pregnant or trying to get pregnant  breast-feeding How should I use this medicine? This vaccine is for injection into a muscle. It is given by a health care professional. A copy of Vaccine Information Statements will be given before each vaccination. Read this sheet carefully each time. The sheet may change frequently. Talk to your pediatrician regarding the use of this medicine in children. Special care may be needed. Overdosage: If you think you have taken too much of this medicine contact a poison control center or emergency room at once. NOTE: This medicine is only for you. Do not share this medicine with others. What if I miss a dose? It is important not to miss your dose. Call your doctor or health care professional if you are  unable to keep an appointment. What may interact with this medicine?  medicines that suppress your immune function like adalimumab, anakinra, infliximab  medicines to treat cancer  steroid medicines like prednisone or cortisone This list may not describe all possible interactions. Give your health care provider a list of all the medicines, herbs, non-prescription drugs, or dietary supplements you use. Also tell them if you smoke, drink alcohol, or use illegal drugs. Some items may interact with your medicine. What should I watch for while using this medicine? See your health care provider for all shots of this vaccine as directed. You must have 3 to 4 shots of this vaccine for protection from hepatitis A and B infection. Tell your doctor right away if you have any serious or unusual side effects after getting this vaccine. What side effects may I notice from receiving this medicine? Side effects that you should report to your doctor or health care professional as soon as possible:  allergic reactions like skin rash, itching or hives, swelling of the face, lips, or tongue  breathing problems  confused, irritated  fast,  irregular heartbeat  flu-like syndrome  numb, tingling pain  seizures Side effects that usually do not require medical attention (report to your doctor or health care professional if they continue or are bothersome):  diarrhea  fever  headache  loss of appetite  muscle pain  nausea  pain, redness, swelling, or irritation at site where injected  tiredness This list may not describe all possible side effects. Call your doctor for medical advice about side effects. You may report side effects to FDA at 1-800-FDA-1088. Where should I keep my medicine? This drug is given in a hospital or clinic and will not be stored at home. NOTE: This sheet is a summary. It may not cover all possible information. If you have questions about this medicine, talk to your doctor, pharmacist, or health care provider.  2020 Elsevier/Gold Standard (2007-09-07 [id number])   Nonalcoholic Fatty Liver Disease Diet, Adult Nonalcoholic fatty liver disease is a condition that causes fat to build up in and around the liver. The disease makes it harder for the liver to work the way that it should. Following a healthy diet can help to keep nonalcoholic fatty liver disease under control. It can also help to prevent or improve conditions that are associated with the disease, such as heart disease, diabetes, high blood pressure, and abnormal cholesterol levels. Along with regular exercise, this diet:  Promotes weight loss.  Helps to control blood sugar levels.  Helps to improve the way that the body uses insulin. What are tips for following this plan? Reading food labels Always check food labels for:  The amount of saturated fat in a food. You should limit your intake of saturated fat. Saturated fat is found in foods that come from animals, including meat and dairy products such as butter, cheese, and whole milk.  The amount of fiber in a food. You should choose high-fiber foods such as fruits, vegetables, and  whole grains. Try to get 25-30 grams (g) of fiber a day.  Cooking  When cooking, use heart-healthy oils that are high in monounsaturated fats. These include olive oil, canola oil, and avocado oil.  Limit frying or deep-frying foods. Cook foods using healthy methods such as baking, boiling, steaming, and grilling instead. Meal planning  You may want to keep track of how many calories you take in. Eating the right amount of calories will help you achieve a healthy  weight. Meeting with a registered dietitian can help you get started.  Limit how often you eat takeout and fast food. These foods are usually very high in fat, salt, and sugar.  Use the glycemic index (GI) to plan your meals. The index tells you how quickly a food will raise your blood sugar. Choose low-GI foods (GI less than 55). These foods take a longer time to raise blood sugar. A registered dietitian can help you identify foods lower on the GI scale. Lifestyle  You may want to follow a Mediterranean diet. This diet includes a lot of vegetables, lean meats or fish, whole grains, fruits, and healthy oils and fats. What foods can I eat?  Fruits Bananas. Apples. Oranges. Grapes. Papaya. Mango. Pomegranate. Kiwi. Grapefruit. Cherries. Vegetables Lettuce. Spinach. Peas. Beets. Cauliflower. Cabbage. Broccoli. Carrots. Tomatoes. Squash. Eggplant. Herbs. Peppers. Onions. Cucumbers. Brussels sprouts. Yams and sweet potatoes. Beans. Lentils. Grains Whole wheat or whole-grain foods, including breads, crackers, cereals, and pasta. Stone-ground whole wheat. Unsweetened oatmeal. Bulgur. Barley. Quinoa. Brown or wild rice. Corn or whole wheat flour tortillas. Meats and other proteins Lean meats. Poultry. Tofu. Seafood and shellfish. Dairy Low-fat or fat-free dairy products, such as yogurt, cottage cheese, or cheese. Beverages Water. Sugar-free drinks. Tea. Coffee. Low-fat or skim milk. Milk alternatives, such as soy or almond milk. Real  fruit juice. Fats and oils Avocado. Canola or olive oil. Nuts and nut butters. Seeds. Seasonings and condiments Mustard. Relish. Low-fat, low-sugar ketchup and barbecue sauce. Low-fat or fat-free mayonnaise. Sweets and desserts Sugar-free sweets. The items listed above may not be a complete list of foods and beverages you can eat. Contact a dietitian for more information. What foods should I limit or avoid? Meats and other proteins Limit red meat to 1-2 times a week. Dairy [employer]. Fats and oils Palm oil and coconut oil. Fried foods. Other foods Processed foods. Foods that contain a lot of salt or sodium. Sweets and desserts Sweets that contain sugar. Beverages Sweetened drinks, such as sweet tea, milkshakes, iced sweet drinks, and sodas. Alcohol. The items listed above may not be a complete list of foods and beverages you should avoid. Contact a dietitian for more information. Where to find more information The [employer] of Diabetes and Digestive and Kidney Diseases: [email or web address] Summary  Nonalcoholic fatty liver disease is a condition that causes fat to build up in and around the liver.  Following a healthy diet can help to keep nonalcoholic fatty liver disease under control. Your diet should be rich in fruits, vegetables, whole grains, and lean proteins.  Limit your intake of saturated fat. Saturated fat is found in foods that come from animals, including meat and dairy products such as butter, cheese, and whole milk.  This diet promotes weight loss, helps to control blood sugar levels, and helps to improve the way that the body uses insulin. This information is not intended to replace advice given to you by your health care provider. Make sure you discuss any questions you have with your health care provider. Document Released: 09/09/2014 Document Revised: 08/17/2018 Document Reviewed: 05/17/2018 Elsevier Patient Education  2020 [place].  Fatty Liver  Disease  Fatty liver disease occurs when too much fat has built up in your liver cells. Fatty liver disease is also called hepatic steatosis or steatohepatitis. The liver removes harmful substances from your bloodstream and produces fluids that your body needs. It also helps your body use and store energy from the food you eat. In many cases,  fatty liver disease does not cause symptoms or problems. It is often diagnosed when tests are being done for other reasons. However, over time, fatty liver can cause inflammation that may lead to more serious liver problems, such as scarring of the liver (cirrhosis) and liver failure. Fatty liver is associated with insulin resistance, increased body fat, high blood pressure (hypertension), and high cholesterol. These are features of metabolic syndrome and increase your risk for stroke, diabetes, and heart disease. What are the causes? This condition may be caused by:  Drinking too much alcohol.  Poor nutrition.  Obesity.  Cushing's syndrome.  Diabetes.  High cholesterol.  Certain drugs.  Poisons.  Some viral infections.  Pregnancy. What increases the risk? You are more likely to develop this condition if you:  Abuse alcohol.  Are overweight.  Have diabetes.  Have hepatitis.  Have a high triglyceride level.  Are pregnant. What are the signs or symptoms? Fatty liver disease often does not cause symptoms. If symptoms do develop, they can include:  Fatigue.  Weakness.  Weight loss.  Confusion.  Abdominal pain.  Nausea and vomiting.  Yellowing of your skin and the white parts of your eyes (jaundice).  Itchy skin. How is this diagnosed? This condition may be diagnosed by:  A physical exam and medical history.  Blood tests.  Imaging tests, such as an ultrasound, CT scan, or MRI.  A liver biopsy. A small sample of liver tissue is removed using a needle. The sample is then looked at under a microscope. How is this  treated? Fatty liver disease is often caused by other health conditions. Treatment for fatty liver may involve medicines and lifestyle changes to manage conditions such as:  Alcoholism.  High cholesterol.  Diabetes.  Being overweight or obese. Follow these instructions at home:   Do not drink alcohol. If you have trouble quitting, ask your health care provider how to safely quit with the help of medicine or a supervised program. This is important to keep your condition from getting worse.  Eat a healthy diet as told by your health care provider. Ask your health care provider about working with a diet and nutrition specialist (dietitian) to develop an eating plan.  Exercise regularly. This can help you lose weight and control your cholesterol and diabetes. Talk to your health care provider about an exercise plan and which activities are best for you.  Take over-the-counter and prescription medicines only as told by your health care provider.  Keep all follow-up visits as told by your health care provider. This is important. Contact a health care provider if: You have trouble controlling your:  Blood sugar. This is especially important if you have diabetes.  Cholesterol.  Drinking of alcohol. Get help right away if:  You have abdominal pain.  You have jaundice.  You have nausea and vomiting.  You vomit blood or material that looks like coffee grounds.  You have stools that are black, tar-like, or bloody. Summary  Fatty liver disease develops when too much fat builds up in the cells of your liver.  Fatty liver disease often causes no symptoms or problems. However, over time, fatty liver can cause inflammation that may lead to more serious liver problems, such as scarring of the liver (cirrhosis).  You are more likely to develop this condition if you abuse alcohol, are pregnant, are overweight, have diabetes, have hepatitis, or have high triglyceride levels.  Contact your  health care provider if you have trouble controlling your weight,  blood sugar, cholesterol, or drinking of alcohol. This information is not intended to replace advice given to you by your health care provider. Make sure you discuss any questions you have with your health care provider. Document Released: 06/10/2005 Document Revised: 04/07/2017 Document Reviewed: 02/01/2017 Elsevier Patient Education  2020 [employer].

## 2019-02-14 ENCOUNTER — Telehealth: Payer: Self-pay | Admitting: Internal Medicine

## 2019-02-14 NOTE — Telephone Encounter (Signed)
I called pt and left a vm to call ofc to schedule Return in about 3 months (around 05/16/2019).

## 2019-02-18 ENCOUNTER — Telehealth: Payer: Self-pay

## 2019-02-18 NOTE — Telephone Encounter (Signed)
Pt called and was given information below.

## 2019-02-18 NOTE — Telephone Encounter (Signed)
Left message for patient to return call back. [facility] may give information.  

## 2019-02-18 NOTE — Telephone Encounter (Signed)
-----   Message from Delorise Jackson, MD sent at 02/13/2019  7:14 PM EDT ----- Call pt and rec she have fasting labs 05/12/19 or after at [place] orders in before next appt in 3 months

## 2019-04-23 ENCOUNTER — Other Ambulatory Visit: Payer: Self-pay | Admitting: Internal Medicine

## 2019-04-23 DIAGNOSIS — I10 Essential (primary) hypertension: Secondary | ICD-10-CM

## 2019-04-23 MED ORDER — AMLODIPINE BESYLATE 10 MG PO TABS: 10.0000 mg | ORAL_TABLET | Freq: Every day | ORAL | 3 refills | Status: DC

## 2019-04-23 MED ORDER — LOSARTAN POTASSIUM 100 MG PO TABS: 100.0000 mg | ORAL_TABLET | Freq: Every day | ORAL | 3 refills | Status: DC

## 2019-05-01 ENCOUNTER — Other Ambulatory Visit: Payer: Self-pay | Admitting: Internal Medicine

## 2019-05-01 DIAGNOSIS — E785 Hyperlipidemia, unspecified: Secondary | ICD-10-CM

## 2019-05-01 MED ORDER — ATORVASTATIN CALCIUM 20 MG PO TABS: ORAL_TABLET | ORAL | 3 refills | Status: DC

## 2019-05-14 LAB — COMPREHENSIVE METABOLIC PANEL
ALT: 33 IU/L — ABNORMAL HIGH (ref 0–32)
AST: 20 IU/L (ref 0–40)
Albumin/Globulin Ratio: 1.6 (ref 1.2–2.2)
Albumin: 4.3 g/dL (ref 3.8–4.8)
Alkaline Phosphatase: 73 IU/L (ref 39–117)
BUN/Creatinine Ratio: 19 (ref 12–28)
BUN: 14 mg/dL (ref 8–27)
Bilirubin Total: 1.1 mg/dL (ref 0.0–1.2)
CO2: 22 mmol/L (ref 20–29)
Calcium: 9.7 mg/dL (ref 8.7–10.3)
Chloride: 103 mmol/L (ref 96–106)
Creatinine, Ser: 0.72 mg/dL (ref 0.57–1.00)
GFR calc Af Amer: 102 mL/min/{1.73_m2} (ref >59)
GFR calc non Af Amer: 89 mL/min/{1.73_m2} (ref >59)
Globulin, Total: 2.7 g/dL (ref 1.5–4.5)
Glucose: 156 mg/dL — ABNORMAL HIGH (ref 65–99)
Potassium: 4.6 mmol/L (ref 3.5–5.2)
Sodium: 140 mmol/L (ref 134–144)
Total Protein: 7 g/dL (ref 6.0–8.5)

## 2019-05-14 LAB — LIPID PANEL
Chol/HDL Ratio: 2.7 ratio (ref 0.0–4.4)
Cholesterol, Total: 170 mg/dL (ref 100–199)
HDL: 64 mg/dL (ref >39)
LDL Chol Calc (NIH): 91 mg/dL (ref 0–99)
Triglycerides: 79 mg/dL (ref 0–149)
VLDL Cholesterol Cal: 15 mg/dL (ref 5–40)

## 2019-05-14 LAB — HEMOGLOBIN A1C
Est. average glucose Bld gHb Est-mCnc: 140 mg/dL
Hgb A1c MFr Bld: 6.5 % — ABNORMAL HIGH (ref 4.8–5.6)

## 2019-05-21 ENCOUNTER — Ambulatory Visit (INDEPENDENT_AMBULATORY_CARE_PROVIDER_SITE_OTHER): Payer: 59 | Admitting: Internal Medicine

## 2019-05-21 ENCOUNTER — Encounter: Payer: Self-pay | Admitting: Internal Medicine

## 2019-05-21 ENCOUNTER — Other Ambulatory Visit: Payer: Self-pay

## 2019-05-21 VITALS — Ht 62.0 in | Wt 167.0 lb

## 2019-05-21 DIAGNOSIS — R7989 Other specified abnormal findings of blood chemistry: Secondary | ICD-10-CM

## 2019-05-21 DIAGNOSIS — K760 Fatty (change of) liver, not elsewhere classified: Secondary | ICD-10-CM

## 2019-05-21 DIAGNOSIS — E1165 Type 2 diabetes mellitus with hyperglycemia: Secondary | ICD-10-CM | POA: Diagnosis not present

## 2019-05-21 DIAGNOSIS — I10 Essential (primary) hypertension: Secondary | ICD-10-CM | POA: Diagnosis not present

## 2019-05-21 DIAGNOSIS — Z1231 Encounter for screening mammogram for malignant neoplasm of breast: Secondary | ICD-10-CM

## 2019-05-21 DIAGNOSIS — E2839 Other primary ovarian failure: Secondary | ICD-10-CM

## 2019-05-21 MED ORDER — GLIMEPIRIDE 2 MG PO TABS: 2.0000 mg | ORAL_TABLET | Freq: Every day | ORAL | 3 refills | Status: DC

## 2019-05-21 NOTE — Progress Notes (Signed)
Virtual Visit via Video Note  I connected with Jessica Snyder  on 05/21/19 at  2:45 PM EST by a video enabled telemedicine application and verified that I am speaking with the correct person using two identifiers.  Location patient: home Location provider:work or home office Persons participating in the virtual visit: patient, provider  I discussed the limitations of evaluation and management by telemedicine and the availability of in person appointments. The patient expressed understanding and agreed to proceed.   HPI: 1. DM 2 A1C improved from 11.2 to 6.5 after 1.5 months of ozempic 0.25 and she has been off x 1.5 months and exercising bid walking 3 miles and qod doing power walking video. cbgs in the am 1-5-120 on amaryl 2 mg qd and januvia 100 mg qd She is down 30 #s lbs changing diet and exercise at [facility] Lipid panel with improved tgs  Results for JULY, LINAM (MRN [id number]) as of 05/21/2019 17:45  Ref. Range 02/08/2019 08:06 05/13/2019 08:31  Triglycerides Latest Ref Range: 0 - 149 mg/dL 151 (H) 79   She has not checked BP due to no home monitor we discussed getting one from [facility] today  2. S/p b/l cataract surgery 11/20 and 04/14/19 with her eye MD  3. Fatty liver with elevated lfts trending down   No complaints today  ROS: See pertinent positives and negatives per HPI. Neuro: denies dizziness and h/a   Past Medical History:  Diagnosis Date  . Allergy   . Arthritis    hands   . Asthma   . Colon polyps   . Depression   . Diabetes mellitus without complication ([place])   . Diverticulitis   . Fatty liver   . History of chicken pox   . Hyperlipidemia   . Hypertension     Past Surgical History:  Procedure Laterality Date  . ABDOMINAL HYSTERECTOMY     DUB ovaries intact s/p bladder suspension/sling no h/o abnormal pap   . CATARACT EXTRACTION, BILATERAL     right 03/29/19 and 04/14/19 Left     Family History  Problem Relation Age of Onset  . Arthritis Mother   .  Depression Mother   . Diabetes Mother   . Heart disease Mother        MI, CHF  . Hyperlipidemia Mother   . Hypertension Mother   . Diabetes Father   . Heart disease Father   . Stroke Father   . Cancer Sister        glioblastoma  . Depression Sister   . Diabetes Sister   . Hyperlipidemia Sister   . Hypertension Sister   . Mental illness Sister   . Diabetes Son        type 1  . Heart disease Maternal Grandmother   . Heart disease Maternal Grandfather     SOCIAL HX:  Divorced  1 son  Associates degree, [employer] medical coding educator  Sister lives with her  No guns Wears seat belt, safe in relationship  Former smoker quit age 19 smoked 12-15 years total 1ppd  1 dog Sadie  Current Outpatient Medications:  .  albuterol (PROVENTIL HFA;VENTOLIN HFA) 108 (90 Base) MCG/ACT inhaler, Inhale 2 puffs into the lungs every 6 (six) hours as needed. , Disp: , Rfl:  .  amLODipine (NORVASC) 10 MG tablet, Take 1 tablet (10 mg total) by mouth daily., Disp: 90 tablet, Rfl: 3 .  aspirin EC 81 MG tablet, Take by mouth daily. , Disp: , Rfl:  .  atorvastatin (LIPITOR) 20 MG tablet, TAKE 1 TABLET ONE TIME DAILY at night, Disp: 90 tablet, Rfl: 3 .  cetirizine (ZYRTEC) 10 MG tablet, Take 10 mg by mouth daily as needed for allergies., Disp: , Rfl:  .  Cholecalciferol 1.25 MG (50000 UT) capsule, Take 1 capsule (50,000 Units total) by mouth once a week., Disp: 13 capsule, Rfl: 1 .  fluticasone furoate-vilanterol (BREO ELLIPTA) 200-25 MCG/INH AEPB, Inhale 1 puff into the lungs daily. Rinse mouth with use, Disp: 180 each, Rfl: 4 .  glimepiride (AMARYL) 2 MG tablet, Take 1 tablet (2 mg total) by mouth daily with breakfast., Disp: 90 tablet, Rfl: 3 .  hydrochlorothiazide (HYDRODIURIL) 25 MG tablet, Take 1 tablet (25 mg total) by mouth daily. In am, Disp: 90 tablet, Rfl: 3 .  ibuprofen (ADVIL,MOTRIN) 200 MG tablet, Take by mouth every 6 (six) hours as needed. , Disp: , Rfl:  .  losartan (COZAAR) 100 MG tablet,  Take 1 tablet (100 mg total) by mouth daily., Disp: 90 tablet, Rfl: 3 .  montelukast (SINGULAIR) 10 MG tablet, Take 10 mg by mouth at bedtime., Disp: , Rfl:  .  potassium chloride (K-DUR) 10 MEQ tablet, Take 1 tablet (10 mEq total) by mouth daily., Disp: 90 tablet, Rfl: 3 .  sertraline (ZOLOFT) 100 MG tablet, TAKE 1 TABLET EVERY DAY, Disp: , Rfl:  .  sitaGLIPtin (JANUVIA) 100 MG tablet, Take 1 tablet (100 mg total) by mouth daily., Disp: 90 tablet, Rfl: 3  EXAM:  VITALS per patient if applicable:  GENERAL: alert, oriented, appears well and in no acute distress  HEENT: atraumatic, conjunttiva clear, no obvious abnormalities on inspection of external nose and ears  NECK: normal movements of the head and neck  LUNGS: on inspection no signs of respiratory distress, breathing rate appears normal, no obvious gross SOB, gasping or wheezing  CV: no obvious cyanosis  MS: moves all visible extremities without noticeable abnormality  PSYCH/NEURO: pleasant and cooperative, no obvious depression or anxiety, speech and thought processing grossly intact  ASSESSMENT AND PLAN:  Discussed the following assessment and plan:  Type 2 diabetes mellitus with hyperglycemia, without long-term current use of insulin (HCC) - Plan: glimepiride (AMARYL) 2 MG tablet, januvia 100 mg qd  S/p cataract ext b/l doing well  Will do foot exam in future  On ARB and statin   Fatty liver Given diet and info monitor lfts h/o elevated liver function tests  Essential hypertension rec monitor BP get cuff from [facility]  HM Fasting labs [facility] mid 08/2019   Flu shotutd -rec pna 23 repeatin futurewill get prevnar 1st in 07/2019  -rec twinrix x 3 doses with h/o fatty liver  -recTdap Considering covid 19 vaccine had shingrix2/2 Declines MMR vx   S/p hysterectomy DUB no h/o abnormal pap no need further pap mammo neg 02/06/19 ordered 01/2020  Colonoscopy 07/07/16 tubular adenoma neg path repeat in 5 years  DEXA  consider age 60 ordered 01/2020  Hep C neg 02/24/15  Skin no current issues reassess each visit   Pulm [facility] [place]  [facility] dentist  Former PCP Dr. Arbutus Ped MD 12/21/17 last seen   [facility] mail order refill  [place]  -we discussed possible serious and likely etiologies, options for evaluation and workup, limitations of telemedicine visit vs in person visit, treatment, treatment risks and precautions. Pt prefers to treat via telemedicine empirically rather then risking or undertaking an in person visit at this  moment. Patient agrees to seek prompt in person care if worsening, new symptoms arise, or if is not improving with treatment.   I discussed the assessment and treatment plan with the patient. The patient was provided an opportunity to ask questions and all were answered. The patient agreed with the plan and demonstrated an understanding of the instructions.   The patient was advised to call back or seek an in-person evaluation if the symptoms worsen or if the condition fails to improve as anticipated.  Time spent 25 minutes  Delorise Jackson, MD

## 2019-05-21 NOTE — Patient Instructions (Addendum)
Consider twinrix vaccine in our office, Tdap, prevnar vaccines    Budget-Friendly Healthy Eating There are many ways to save money at the grocery store and continue to eat healthy. You can be successful if you:  Plan meals according to your budget.  Make a grocery list and only purchase food according to your grocery list.  Prepare food yourself. What are tips for following this plan?  Reading food labels  Compare food labels between brand name foods and the store brand. Often the nutritional value is the same, but the store brand is lower cost.  Look for products that do not have added sugar, fat, or salt (sodium). These often cost the same but are healthier for you. Products may be labeled as: ? Sugar-free. ? Nonfat. ? Low-fat. ? Sodium-free. ? Low-sodium.  Look for lean ground beef labeled as at least 92% lean and 8% fat. Shopping  Buy only the items on your grocery list and go only to the areas of the store that have the items on your list.  Use coupons only for foods and brands you normally buy. Avoid buying items you wouldn't normally buy simply because they are on sale.  Check online and in newspapers for weekly deals.  Buy healthy items from the bulk bins when available, such as herbs, spices, flour, pasta, nuts, and dried fruit.  Buy fruits and vegetables that are in season. Prices are usually lower on in-season produce.  Look at the unit price on the price tag. Use it to compare different brands and sizes to find out which item is the best deal.  Choose healthy items that are often low-cost, such as carrots, potatoes, apples, bananas, and oranges. Dried or canned beans are a low-cost protein source.  Buy in bulk and freeze extra food. Items you can buy in bulk include meats, fish, poultry, frozen fruits, and frozen vegetables.  Avoid buying "ready-to-eat" foods, such as pre-cut fruits and vegetables and pre-made salads.  If possible, shop around to discover  where you can find the best prices. Consider other retailers such as dollar stores, larger [employer], local fruit and vegetable stands, and farmers markets.  Do not shop when you are hungry. If you shop while hungry, it may be hard to stick to your list and budget.  Resist impulse buying. Use your grocery list as your official plan for the week.  Buy a variety of vegetables and fruits by purchasing fresh, frozen, and canned items.  Look at the top and bottom shelves for deals. Foods at eye level (eye level of an adult or child) are usually more expensive.  Be efficient with your time when shopping. The more time you spend at the store, the more money you are likely to spend.  To save money when choosing more expensive foods like meats and dairy: ? Choose cheaper cuts of meat, such as bone-in chicken thighs and drumsticks instead of skinless and boneless chicken. When you are ready to prepare the chicken, you can remove the skin yourself to make it healthier. ? Choose lean meats like chicken or [place] instead of beef. ? Choose canned seafood, such as tuna, salmon, or sardines. ? Buy eggs as a low-cost source of protein. ? Buy dried beans and peas, such as lentils, split peas, or kidney beans instead of meats. Dried beans and peas are a good alternative source of protein. ? Buy the larger tubs of yogurt instead of individual-sized containers.  Choose water instead of sodas and other  sweetened beverages.  Avoid buying chips, cookies, and other "junk food." These items are usually expensive and not healthy. Cooking  Make extra food and freeze the extras in meal-sized containers or in individual portions for fast meals and snacks.  Pre-cook on days when you have extra time to prepare meals in advance. You can keep these meals in the fridge or freezer and reheat for a quick meal.  When you come home from the grocery store, wash, peel, and cut fruits and vegetables so they are ready to  use and eat. This will help reduce food waste. Meal planning  Do not eat out or get fast food. Prepare food at home.  Make a grocery list and make sure to bring it with you to the store. If you have a smart phone, you could use your phone to create your shopping list.  Plan meals and snacks according to a grocery list and budget you create.  Use leftovers in your meal plan for the week.  Look for recipes where you can cook once and make enough food for two meals.  Include budget-friendly meals like stews, casseroles, and stir-fry dishes.  Try some meatless meals or try "no cook" meals like salads.  Make sure that half your plate is filled with fruits or vegetables. Choose from fresh, frozen, or canned fruits and vegetables. If eating canned, remember to rinse them before eating. This will remove any excess salt added for packaging. Summary  Eating healthy on a budget is possible if you plan your meals according to your budget, purchase according to your budget and grocery list, and prepare food yourself.  Tips for buying more food on a limited budget include buying generic brands, using coupons only for foods you normally buy, and buying healthy items from the bulk bins when available.  Tips for buying cheaper food to replace expensive food include choosing cheaper, lean cuts of meat, and buying dried beans and peas. This information is not intended to replace advice given to you by your health care provider. Make sure you discuss any questions you have with your health care provider. Document Revised: 04/26/2017 Document Reviewed: 04/26/2017 Elsevier Patient Education  [place].  Diabetes Mellitus and Nutrition, Adult When you have diabetes (diabetes mellitus), it is very important to have healthy eating habits because your blood sugar (glucose) levels are greatly affected by what you eat and drink. Eating healthy foods in the appropriate amounts, at about the same times  every day, can help you:  Control your blood glucose.  Lower your risk of heart disease.  Improve your blood pressure.  Reach or maintain a healthy weight. Every person with diabetes is different, and each person has different needs for a meal plan. Your health care provider may recommend that you work with a diet and nutrition specialist (dietitian) to make a meal plan that is best for you. Your meal plan may vary depending on factors such as:  The calories you need.  The medicines you take.  Your weight.  Your blood glucose, blood pressure, and cholesterol levels.  Your activity level.  Other health conditions you have, such as heart or kidney disease. How do carbohydrates affect me? Carbohydrates, also called carbs, affect your blood glucose level more than any other type of food. Eating carbs naturally raises the amount of glucose in your blood. Carb counting is a method for keeping track of how many carbs you eat. Counting carbs is important to keep your blood glucose  at a healthy level, especially if you use insulin or take certain oral diabetes medicines. It is important to know how many carbs you can safely have in each meal. This is different for every person. Your dietitian can help you calculate how many carbs you should have at each meal and for each snack. Foods that contain carbs include:  Bread, cereal, rice, pasta, and crackers.  Potatoes and corn.  Peas, beans, and lentils.  Milk and yogurt.  Fruit and juice.  Desserts, such as cakes, cookies, ice cream, and candy. How does alcohol affect me? Alcohol can cause a sudden decrease in blood glucose (hypoglycemia), especially if you use insulin or take certain oral diabetes medicines. Hypoglycemia can be a life-threatening condition. Symptoms of hypoglycemia (sleepiness, dizziness, and confusion) are similar to symptoms of having too much alcohol. If your health care provider says that alcohol is safe for you,  follow these guidelines:  Limit alcohol intake to no more than 1 drink per day for nonpregnant women and 2 drinks per day for men. One drink equals 12 oz of beer, 5 oz of wine, or 1 oz of hard liquor.  Do not drink on an empty stomach.  Keep yourself hydrated with water, diet soda, or unsweetened iced tea.  Keep in mind that regular soda, juice, and other mixers may contain a lot of sugar and must be counted as carbs. What are tips for following this plan?  Reading food labels  Start by checking the serving size on the "Nutrition Facts" label of packaged foods and drinks. The amount of calories, carbs, fats, and other nutrients listed on the label is based on one serving of the item. Many items contain more than one serving per package.  Check the total grams (g) of carbs in one serving. You can calculate the number of servings of carbs in one serving by dividing the total carbs by 15. For example, if a food has 30 g of total carbs, it would be equal to 2 servings of carbs.  Check the number of grams (g) of saturated and trans fats in one serving. Choose foods that have low or no amount of these fats.  Check the number of milligrams (mg) of salt (sodium) in one serving. Most people should limit total sodium intake to less than 2,300 mg per day.  Always check the nutrition information of foods labeled as "low-fat" or "nonfat". These foods may be higher in added sugar or refined carbs and should be avoided.  Talk to your dietitian to identify your daily goals for nutrients listed on the label. Shopping  Avoid buying canned, premade, or processed foods. These foods tend to be high in fat, sodium, and added sugar.  Shop around the outside edge of the grocery store. This includes fresh fruits and vegetables, bulk grains, fresh meats, and fresh dairy. Cooking  Use low-heat cooking methods, such as baking, instead of high-heat cooking methods like deep frying.  Cook using healthy oils,  such as olive, canola, or sunflower oil.  Avoid cooking with butter, cream, or high-fat meats. Meal planning  Eat meals and snacks regularly, preferably at the same times every day. Avoid going long periods of time without eating.  Eat foods high in fiber, such as fresh fruits, vegetables, beans, and whole grains. Talk to your dietitian about how many servings of carbs you can eat at each meal.  Eat 4-6 ounces (oz) of lean protein each day, such as lean meat, chicken, fish, eggs, or  tofu. One oz of lean protein is equal to: ? 1 oz of meat, chicken, or fish. ? 1 egg. ?  cup of tofu.  Eat some foods each day that contain healthy fats, such as avocado, nuts, seeds, and fish. Lifestyle  Check your blood glucose regularly.  Exercise regularly as told by your health care provider. This may include: ? 150 minutes of moderate-intensity or vigorous-intensity exercise each week. This could be brisk walking, biking, or water aerobics. ? Stretching and doing strength exercises, such as yoga or weightlifting, at least 2 times a week.  Take medicines as told by your health care provider.  Do not use any products that contain nicotine or tobacco, such as cigarettes and e-cigarettes. If you need help quitting, ask your health care provider.  Work with a Social worker or diabetes educator to identify strategies to manage stress and any emotional and social challenges. Questions to ask a health care provider  Do I need to meet with a diabetes educator?  Do I need to meet with a dietitian?  What number can I call if I have questions?  When are the best times to check my blood glucose? Where to find more information:  American Diabetes Association: diabetes.org  Academy of Nutrition and Dietetics: www.eatright.[employer] of Diabetes and Digestive and Kidney Diseases (NIH): [employer] Summary  A healthy meal plan will help you control your blood glucose and maintain a  healthy lifestyle.  Working with a diet and nutrition specialist (dietitian) can help you make a meal plan that is best for you.  Keep in mind that carbohydrates (carbs) and alcohol have immediate effects on your blood glucose levels. It is important to count carbs and to use alcohol carefully. This information is not intended to replace advice given to you by your health care provider. Make sure you discuss any questions you have with your health care provider. Document Revised: 04/07/2017 Document Reviewed: 05/30/2016 Elsevier Patient Education  2020 [place].  Fatty Liver Disease  Fatty liver disease occurs when too much fat has built up in your liver cells. Fatty liver disease is also called hepatic steatosis or steatohepatitis. The liver removes harmful substances from your bloodstream and produces fluids that your body needs. It also helps your body use and store energy from the food you eat. In many cases, fatty liver disease does not cause symptoms or problems. It is often diagnosed when tests are being done for other reasons. However, over time, fatty liver can cause inflammation that may lead to more serious liver problems, such as scarring of the liver (cirrhosis) and liver failure. Fatty liver is associated with insulin resistance, increased body fat, high blood pressure (hypertension), and high cholesterol. These are features of metabolic syndrome and increase your risk for stroke, diabetes, and heart disease. What are the causes? This condition may be caused by:  Drinking too much alcohol.  Poor nutrition.  Obesity.  Cushing's syndrome.  Diabetes.  High cholesterol.  Certain drugs.  Poisons.  Some viral infections.  Pregnancy. What increases the risk? You are more likely to develop this condition if you:  Abuse alcohol.  Are overweight.  Have diabetes.  Have hepatitis.  Have a high triglyceride level.  Are pregnant. What are the signs or symptoms?  Fatty liver disease often does not cause symptoms. If symptoms do develop, they can include:  Fatigue.  Weakness.  Weight loss.  Confusion.  Abdominal pain.  Nausea and vomiting.  Yellowing of your skin and  the white parts of your eyes (jaundice).  Itchy skin. How is this diagnosed? This condition may be diagnosed by:  A physical exam and medical history.  Blood tests.  Imaging tests, such as an ultrasound, CT scan, or MRI.  A liver biopsy. A small sample of liver tissue is removed using a needle. The sample is then looked at under a microscope. How is this treated? Fatty liver disease is often caused by other health conditions. Treatment for fatty liver may involve medicines and lifestyle changes to manage conditions such as:  Alcoholism.  High cholesterol.  Diabetes.  Being overweight or obese. Follow these instructions at home:   Do not drink alcohol. If you have trouble quitting, ask your health care provider how to safely quit with the help of medicine or a supervised program. This is important to keep your condition from getting worse.  Eat a healthy diet as told by your health care provider. Ask your health care provider about working with a diet and nutrition specialist (dietitian) to develop an eating plan.  Exercise regularly. This can help you lose weight and control your cholesterol and diabetes. Talk to your health care provider about an exercise plan and which activities are best for you.  Take over-the-counter and prescription medicines only as told by your health care provider.  Keep all follow-up visits as told by your health care provider. This is important. Contact a health care provider if: You have trouble controlling your:  Blood sugar. This is especially important if you have diabetes.  Cholesterol.  Drinking of alcohol. Get help right away if:  You have abdominal pain.  You have jaundice.  You have nausea and vomiting.  You  vomit blood or material that looks like coffee grounds.  You have stools that are black, tar-like, or bloody. Summary  Fatty liver disease develops when too much fat builds up in the cells of your liver.  Fatty liver disease often causes no symptoms or problems. However, over time, fatty liver can cause inflammation that may lead to more serious liver problems, such as scarring of the liver (cirrhosis).  You are more likely to develop this condition if you abuse alcohol, are pregnant, are overweight, have diabetes, have hepatitis, or have high triglyceride levels.  Contact your health care provider if you have trouble controlling your weight, blood sugar, cholesterol, or drinking of alcohol. This information is not intended to replace advice given to you by your health care provider. Make sure you discuss any questions you have with your health care provider. Document Revised: 04/07/2017 Document Reviewed: 02/01/2017 Elsevier Patient Education  2020 [place].   Nonalcoholic Fatty Liver Disease Diet, Adult Nonalcoholic fatty liver disease is a condition that causes fat to build up in and around the liver. The disease makes it harder for the liver to work the way that it should. Following a healthy diet can help to keep nonalcoholic fatty liver disease under control. It can also help to prevent or improve conditions that are associated with the disease, such as heart disease, diabetes, high blood pressure, and abnormal cholesterol levels. Along with regular exercise, this diet:  Promotes weight loss.  Helps to control blood sugar levels.  Helps to improve the way that the body uses insulin. What are tips for following this plan? Reading food labels Always check food labels for:  The amount of saturated fat in a food. You should limit your intake of saturated fat. Saturated fat is found in foods that  come from animals, including meat and dairy products such as butter, cheese, and  whole milk.  The amount of fiber in a food. You should choose high-fiber foods such as fruits, vegetables, and whole grains. Try to get 25-30 grams (g) of fiber a day.  Cooking  When cooking, use heart-healthy oils that are high in monounsaturated fats. These include olive oil, canola oil, and avocado oil.  Limit frying or deep-frying foods. Cook foods using healthy methods such as baking, boiling, steaming, and grilling instead. Meal planning  You may want to keep track of how many calories you take in. Eating the right amount of calories will help you achieve a healthy weight. Meeting with a registered dietitian can help you get started.  Limit how often you eat takeout and fast food. These foods are usually very high in fat, salt, and sugar.  Use the glycemic index (GI) to plan your meals. The index tells you how quickly a food will raise your blood sugar. Choose low-GI foods (GI less than 55). These foods take a longer time to raise blood sugar. A registered dietitian can help you identify foods lower on the GI scale. Lifestyle  You may want to follow a Mediterranean diet. This diet includes a lot of vegetables, lean meats or fish, whole grains, fruits, and healthy oils and fats. What foods can I eat?  Fruits Bananas. Apples. Oranges. Grapes. Papaya. Mango. Pomegranate. Kiwi. Grapefruit. Cherries. Vegetables Lettuce. Spinach. Peas. Beets. Cauliflower. Cabbage. Broccoli. Carrots. Tomatoes. Squash. Eggplant. Herbs. Peppers. Onions. Cucumbers. Brussels sprouts. Yams and sweet potatoes. Beans. Lentils. Grains Whole wheat or whole-grain foods, including breads, crackers, cereals, and pasta. Stone-ground whole wheat. Unsweetened oatmeal. Bulgur. Barley. Quinoa. Brown or wild rice. Corn or whole wheat flour tortillas. Meats and other proteins Lean meats. Poultry. Tofu. Seafood and shellfish. Dairy Low-fat or fat-free dairy products, such as yogurt, cottage cheese, or cheese. Beverages  Water. Sugar-free drinks. Tea. Coffee. Low-fat or skim milk. Milk alternatives, such as soy or almond milk. Real fruit juice. Fats and oils Avocado. Canola or olive oil. Nuts and nut butters. Seeds. Seasonings and condiments Mustard. Relish. Low-fat, low-sugar ketchup and barbecue sauce. Low-fat or fat-free mayonnaise. Sweets and desserts Sugar-free sweets. The items listed above may not be a complete list of foods and beverages you can eat. Contact a dietitian for more information. What foods should I limit or avoid? Meats and other proteins Limit red meat to 1-2 times a week. Dairy [employer]. Fats and oils Palm oil and coconut oil. Fried foods. Other foods Processed foods. Foods that contain a lot of salt or sodium. Sweets and desserts Sweets that contain sugar. Beverages Sweetened drinks, such as sweet tea, milkshakes, iced sweet drinks, and sodas. Alcohol. The items listed above may not be a complete list of foods and beverages you should avoid. Contact a dietitian for more information. Where to find more information [employer] of Diabetes and Digestive and Kidney Diseases: [employer] Summary  Nonalcoholic fatty liver disease is a condition that causes fat to build up in and around the liver.  Following a healthy diet can help to keep nonalcoholic fatty liver disease under control. Your diet should be rich in fruits, vegetables, whole grains, and lean proteins.  Limit your intake of saturated fat. Saturated fat is found in foods that come from animals, including meat and dairy products such as butter, cheese, and whole milk.  This diet promotes weight loss, helps to control blood sugar levels, and helps to  improve the way that the body uses insulin. This information is not intended to replace advice given to you by your health care provider. Make sure you discuss any questions you have with your health care provider. Document Revised: 08/17/2018 Document  Reviewed: 05/17/2018 Elsevier Patient Education  [place].

## 2019-05-22 ENCOUNTER — Telehealth: Payer: Self-pay | Admitting: Internal Medicine

## 2019-05-22 NOTE — Telephone Encounter (Signed)
I called pt and left vm to cal ofc to schedule Return for f/u 3-6 months, sch prevnar 07/12/19 or after .

## 2019-05-28 ENCOUNTER — Telehealth: Payer: Self-pay | Admitting: Internal Medicine

## 2019-05-28 NOTE — Telephone Encounter (Signed)
Noted cbg  [place]

## 2019-05-28 NOTE — Telephone Encounter (Signed)
FYI-Pt called to give her Blood sugar for am 91, lunch 72.

## 2019-06-04 ENCOUNTER — Encounter: Payer: Self-pay | Admitting: Internal Medicine

## 2019-06-05 ENCOUNTER — Encounter: Payer: Self-pay | Admitting: Internal Medicine

## 2019-07-01 ENCOUNTER — Telehealth: Payer: Self-pay | Admitting: Internal Medicine

## 2019-07-01 NOTE — Telephone Encounter (Signed)
Most of the time in the bottom of my note HM I put which vaccines are rec. Or health maintenance  Twinrix 3 doses rec.  0 month  1 month  6 months   Please schedule in office   [place]

## 2019-07-01 NOTE — Telephone Encounter (Signed)
Please advise 

## 2019-07-01 NOTE — Telephone Encounter (Signed)
I do not see anything in chart recommending Hep shot.

## 2019-07-01 NOTE — Telephone Encounter (Signed)
Pt states that she received a letter to schedule a mammogram and bone density test at [facility]. Please put a referral in for that and call pt back. Thanks!

## 2019-07-01 NOTE — Telephone Encounter (Signed)
Pt states that Dr. Gayland Curry wanted her to get a hep shot but there is no orders for it? She said she was supposed to get a call back about that over a month ago. Please advise.

## 2019-07-01 NOTE — Telephone Encounter (Signed)
Left message to return call 

## 2019-07-02 NOTE — Telephone Encounter (Signed)
Messages added to previous encounter from same day.

## 2019-07-02 NOTE — Telephone Encounter (Signed)
Jessica Snyder   MH  8:22 AM Note   Pt was returning your call      DG  8:22 AM Jessica Snyder, Jessica Snyder contacted Jessica Snyder   July 01, 2019   [employer]  11:19 AM Jessica Snyder routed this conversation to Jessica Snyder, Jessica Snyder  Jessica Snyder, Jessica Snyder   MH  11:18 AM Note   Pt states that she received a letter to schedule a mammogram and bone density test at [facility]. Please put a referral in for that and call pt back. Thanks!

## 2019-07-02 NOTE — Telephone Encounter (Signed)
Spoke with patient and informed her that Dr Olivia Mackie has those orders in for her mammogram and Dexa.   Patient will become eligible for her Covid vaccine 07/12/19. Scheduled her first dose of Twinrix for 09/25/19. This way she can receive her Covid series first.

## 2019-07-02 NOTE — Telephone Encounter (Signed)
Pt was returning your call.

## 2019-07-03 ENCOUNTER — Encounter: Payer: Self-pay | Admitting: Internal Medicine

## 2019-07-03 NOTE — Telephone Encounter (Signed)
I called pt and left a vm regarding scheduling her mammo and bone density. I advised pt call [facility] to schedule. No referral needed they have the order.

## 2019-07-10 ENCOUNTER — Telehealth: Payer: Self-pay | Admitting: Lab

## 2019-07-10 ENCOUNTER — Ambulatory Visit
Admission: RE | Admit: 2019-07-10 | Discharge: 2019-07-10 | Disposition: A | Discharge status: Home / Self Care | Payer: 59 | Source: Ambulatory Visit | Attending: Internal Medicine | Admitting: Internal Medicine

## 2019-07-10 DIAGNOSIS — E2839 Other primary ovarian failure: Secondary | ICD-10-CM | POA: Insufficient documentation

## 2019-07-10 NOTE — Telephone Encounter (Signed)
Called Pt with results No answer, left VM to call office.

## 2019-07-24 ENCOUNTER — Other Ambulatory Visit: Payer: Self-pay | Admitting: Internal Medicine

## 2019-07-24 DIAGNOSIS — E1165 Type 2 diabetes mellitus with hyperglycemia: Secondary | ICD-10-CM

## 2019-07-24 MED ORDER — GLIMEPIRIDE 2 MG PO TABS: 1.0000 mg | ORAL_TABLET | Freq: Every day | ORAL | 3 refills | Status: DC

## 2019-07-24 NOTE — Telephone Encounter (Signed)
Pt called in and said yesterday about an hour after lunch she checked her sugar and it was down to 60. It has continued to drop in the mornings when she checks it is below 90. This morning it was 84. She is really concerned and wants to talk about her medication. She is asking for a call back.

## 2019-07-24 NOTE — Telephone Encounter (Signed)
Please advise 

## 2019-07-24 NOTE — Telephone Encounter (Signed)
Have her cut amaryl in 1/2 and always eat with this medication  If sugars continue to be <70 on med stop this medication and continue [place] only  [place]

## 2019-07-25 MED ORDER — FREESTYLE LANCETS MISC: 3 refills | Status: AC

## 2019-07-25 NOTE — Telephone Encounter (Signed)
Patient informed and verbalized understanding.    Patient is requesting a refill on her lancets. She uses [facility]. Pended for your approval or denial.

## 2019-07-25 NOTE — Addendum Note (Signed)
Addended by: Thressa Sheller on: 07/25/2019 08:20 AM   Modules accepted: Orders

## 2019-09-14 LAB — CBC WITH DIFFERENTIAL/PLATELET
Basophils Absolute: 0 10*3/uL (ref 0.0–0.2)
Basos: 1 %
EOS (ABSOLUTE): 0.1 10*3/uL (ref 0.0–0.4)
Eos: 1 %
Hematocrit: 44.3 % (ref 34.0–46.6)
Hemoglobin: 14.9 g/dL (ref 11.1–15.9)
Immature Grans (Abs): 0 10*3/uL (ref 0.0–0.1)
Immature Granulocytes: 0 %
Lymphocytes Absolute: 1.7 10*3/uL (ref 0.7–3.1)
Lymphs: 31 %
MCH: 31.8 pg (ref 26.6–33.0)
MCHC: 33.6 g/dL (ref 31.5–35.7)
MCV: 95 fL (ref 79–97)
Monocytes Absolute: 0.4 10*3/uL (ref 0.1–0.9)
Monocytes: 8 %
Neutrophils Absolute: 3.3 10*3/uL (ref 1.4–7.0)
Neutrophils: 59 %
Platelets: 308 10*3/uL (ref 150–450)
RBC: 4.68 x10E6/uL (ref 3.77–5.28)
RDW: 11.9 % (ref 11.7–15.4)
WBC: 5.6 10*3/uL (ref 3.4–10.8)

## 2019-09-14 LAB — COMPREHENSIVE METABOLIC PANEL
ALT: 17 IU/L (ref 0–32)
AST: 15 IU/L (ref 0–40)
Albumin/Globulin Ratio: 1.6 (ref 1.2–2.2)
Albumin: 4.3 g/dL (ref 3.8–4.8)
Alkaline Phosphatase: 68 IU/L (ref 39–117)
BUN/Creatinine Ratio: 20 (ref 12–28)
BUN: 14 mg/dL (ref 8–27)
Bilirubin Total: 0.9 mg/dL (ref 0.0–1.2)
CO2: 24 mmol/L (ref 20–29)
Calcium: 9.4 mg/dL (ref 8.7–10.3)
Chloride: 104 mmol/L (ref 96–106)
Creatinine, Ser: 0.71 mg/dL (ref 0.57–1.00)
GFR calc Af Amer: 103 mL/min/{1.73_m2} (ref >59)
GFR calc non Af Amer: 90 mL/min/{1.73_m2} (ref >59)
Globulin, Total: 2.7 g/dL (ref 1.5–4.5)
Glucose: 113 mg/dL — ABNORMAL HIGH (ref 65–99)
Potassium: 3.9 mmol/L (ref 3.5–5.2)
Sodium: 144 mmol/L (ref 134–144)
Total Protein: 7 g/dL (ref 6.0–8.5)

## 2019-09-14 LAB — HEMOGLOBIN A1C
Est. average glucose Bld gHb Est-mCnc: 117 mg/dL
Hgb A1c MFr Bld: 5.7 % — ABNORMAL HIGH (ref 4.8–5.6)

## 2019-09-14 LAB — LIPID PANEL
Chol/HDL Ratio: 2.4 ratio (ref 0.0–4.4)
Cholesterol, Total: 165 mg/dL (ref 100–199)
HDL: 68 mg/dL (ref >39)
LDL Chol Calc (NIH): 83 mg/dL (ref 0–99)
Triglycerides: 71 mg/dL (ref 0–149)
VLDL Cholesterol Cal: 14 mg/dL (ref 5–40)

## 2019-09-23 ENCOUNTER — Other Ambulatory Visit: Payer: Self-pay

## 2019-09-25 ENCOUNTER — Other Ambulatory Visit: Payer: Self-pay

## 2019-09-25 ENCOUNTER — Ambulatory Visit (INDEPENDENT_AMBULATORY_CARE_PROVIDER_SITE_OTHER): Payer: 59

## 2019-09-25 ENCOUNTER — Ambulatory Visit (INDEPENDENT_AMBULATORY_CARE_PROVIDER_SITE_OTHER): Payer: 59 | Admitting: Internal Medicine

## 2019-09-25 ENCOUNTER — Encounter: Payer: Self-pay | Admitting: Internal Medicine

## 2019-09-25 VITALS — BP 126/78 | HR 83 | Temp 97.0°F | Ht 62.0 in | Wt 156.0 lb

## 2019-09-25 DIAGNOSIS — Z1329 Encounter for screening for other suspected endocrine disorder: Secondary | ICD-10-CM

## 2019-09-25 DIAGNOSIS — Z23 Encounter for immunization: Secondary | ICD-10-CM | POA: Diagnosis not present

## 2019-09-25 DIAGNOSIS — E1165 Type 2 diabetes mellitus with hyperglycemia: Secondary | ICD-10-CM

## 2019-09-25 DIAGNOSIS — F419 Anxiety disorder, unspecified: Secondary | ICD-10-CM

## 2019-09-25 DIAGNOSIS — S90415A Abrasion, left lesser toe(s), initial encounter: Secondary | ICD-10-CM

## 2019-09-25 DIAGNOSIS — F40243 Fear of flying: Secondary | ICD-10-CM | POA: Insufficient documentation

## 2019-09-25 DIAGNOSIS — I10 Essential (primary) hypertension: Secondary | ICD-10-CM

## 2019-09-25 DIAGNOSIS — Z1389 Encounter for screening for other disorder: Secondary | ICD-10-CM

## 2019-09-25 DIAGNOSIS — R7989 Other specified abnormal findings of blood chemistry: Secondary | ICD-10-CM

## 2019-09-25 DIAGNOSIS — K760 Fatty (change of) liver, not elsewhere classified: Secondary | ICD-10-CM

## 2019-09-25 MED ORDER — MUPIROCIN 2 % EX OINT: 1.0000 "application " | TOPICAL_OINTMENT | Freq: Three times a day (TID) | CUTANEOUS | 0 refills | Status: DC

## 2019-09-25 MED ORDER — DIAZEPAM 5 MG PO TABS: 5.0000 mg | ORAL_TABLET | Freq: Two times a day (BID) | ORAL | 0 refills | Status: DC | PRN

## 2019-09-25 NOTE — Progress Notes (Signed)
Chief Complaint  Patient presents with  . Follow-up   F/u  1. Left great toe wound x 1-2 weeks not heeling after wearing shoes which rubbed her feet wrong nothing tried but avoidance of shoes   2. Anxiety with flying in the past took xanax 1 mg in the past but asking for benzos as going to see son in the next couple of weeks  3. DM 2 A1C 5.7 of 1 mg amaryl x 1 week and only on januvia 100 mg qd changed diet this has improved she is also walking 2 walks per day and working out at night doing wt watchers and watching what she eats am cbg was 82 09/23/19 and 110 off amaryl 1 mg qam  Wt loss goal in the 130s   4. Insomnia/RLS does not want to try medications currently    Review of Systems  Constitutional: Positive for weight loss.  HENT: Negative for hearing loss.   Eyes: Negative for blurred vision.  Respiratory: Negative for shortness of breath.   Cardiovascular: Negative for chest pain.  Gastrointestinal: Negative for abdominal pain.  Musculoskeletal: Negative for falls.  Skin: Negative for rash.  Neurological: Negative for headaches.  Psychiatric/Behavioral: Negative for depression. The patient is nervous/anxious.    Past Medical History:  Diagnosis Date  . Allergy   . Arthritis    hands   . Asthma   . Colon polyps   . Depression   . Diabetes mellitus without complication ([place])   . Diverticulitis   . Fatty liver   . History of chicken pox   . Hyperlipidemia   . Hypertension    Past Surgical History:  Procedure Laterality Date  . ABDOMINAL HYSTERECTOMY     DUB ovaries intact s/p bladder suspension/sling no h/o abnormal pap   . CATARACT EXTRACTION, BILATERAL     right 03/29/19 and 04/14/19 Left    Family History  Problem Relation Age of Onset  . Arthritis Mother   . Depression Mother   . Diabetes Mother   . Heart disease Mother        MI, CHF  . Hyperlipidemia Mother   . Hypertension Mother   . Diabetes Father   . Heart disease Father   . Stroke Father   .  Cancer Sister        glioblastoma  . Depression Sister   . Diabetes Sister   . Hyperlipidemia Sister   . Hypertension Sister   . Mental illness Sister   . Diabetes Son        type 1  . Heart disease Maternal Grandmother   . Heart disease Maternal Grandfather    Social History   Socioeconomic History  . Marital status: Divorced    Spouse name: Not on file  . Number of children: Not on file  . Years of education: Not on file  . Highest education level: Not on file  Occupational History  . Not on file  Tobacco Use  . Smoking status: Former Research scientist (life sciences)  . Smokeless tobacco: Never Used  . Tobacco comment: quit age 40 y.o duration 12-15 years 1 ppd   Substance and Sexual Activity  . Alcohol use: Yes  . Drug use: Not Currently  . Sexual activity: Not Currently  Other Topics Concern  . Not on file  Social History Narrative   Divorced    1 son    1 dog Sadie   Associates degree, Primary school teacher    Sister lives with her  No guns   Wears seat belt, safe in relationship    Former smoker quit age 2 smoked 12-15 years total 1ppd    Social Determinants of Health   Financial Resource Strain:   . Difficulty of Paying Living Expenses:   Food Insecurity:   . Worried About Charity fundraiser in the Last Year:   . Arboriculturist in the Last Year:   Transportation Needs:   . Film/video editor (Medical):   Marland Kitchen Lack of Transportation (Non-Medical):   Physical Activity:   . Days of Exercise per Week:   . Minutes of Exercise per Session:   Stress:   . Feeling of Stress :   Social Connections:   . Frequency of Communication with Friends and Family:   . Frequency of Social Gatherings with Friends and Family:   . Attends Religious Services:   . Active Member of Clubs or Organizations:   . Attends Archivist Meetings:   Marland Kitchen Marital Status:   Intimate Partner Violence:   . Fear of Current or Ex-Partner:   . Emotionally Abused:   Marland Kitchen Physically Abused:   .  Sexually Abused:    Current Meds  Medication Sig  . amLODipine (NORVASC) 10 MG tablet Take 1 tablet (10 mg total) by mouth daily.  Marland Kitchen aspirin EC 81 MG tablet Take by mouth daily.   Marland Kitchen atorvastatin (LIPITOR) 20 MG tablet TAKE 1 TABLET ONE TIME DAILY at night  . Cholecalciferol 1.25 MG (50000 UT) capsule Take 1 capsule (50,000 Units total) by mouth once a week.  . diphenhydrAMINE-APAP, sleep, (TYLENOL PM EXTRA STRENGTH PO) Take by mouth.  . fluticasone furoate-vilanterol (BREO ELLIPTA) 200-25 MCG/INH AEPB Inhale 1 puff into the lungs daily. Rinse mouth with use  . hydrochlorothiazide (HYDRODIURIL) 25 MG tablet Take 1 tablet (25 mg total) by mouth daily. In am  . Lancets (FREESTYLE) lancets Bid Use as instructed e11.9  . loratadine (CLARITIN) 10 MG tablet Take 10 mg by mouth daily.  Marland Kitchen losartan (COZAAR) 100 MG tablet Take 1 tablet (100 mg total) by mouth daily.  . potassium chloride (K-DUR) 10 MEQ tablet Take 1 tablet (10 mEq total) by mouth daily.  . sertraline (ZOLOFT) 100 MG tablet TAKE 1 TABLET EVERY DAY  . sitaGLIPtin (JANUVIA) 100 MG tablet Take 1 tablet (100 mg total) by mouth daily.  . [DISCONTINUED] albuterol (PROVENTIL HFA;VENTOLIN HFA) 108 (90 Base) MCG/ACT inhaler Inhale 2 puffs into the lungs every 6 (six) hours as needed.   . [DISCONTINUED] cetirizine (ZYRTEC) 10 MG tablet Take 10 mg by mouth daily as needed for allergies.  . [DISCONTINUED] ibuprofen (ADVIL,MOTRIN) 200 MG tablet Take by mouth every 6 (six) hours as needed.    Allergies  Allergen Reactions  . Amoxicillin-Pot Clavulanate Diarrhea  . Ciprofloxacin Other (See Comments)    Made her more sick  . Metformin Diarrhea   Recent Results (from the past 2160 hour(s))  Comprehensive metabolic panel     Status: Abnormal   Collection Time: 09/13/19  8:12 AM  Result Value Ref Range   Glucose 113 (H) 65 - 99 mg/dL   BUN 14 8 - 27 mg/dL   Creatinine, Ser 0.71 0.57 - 1.00 mg/dL   GFR calc non Af Amer 90 >59 mL/min/1.73   GFR  calc Af Amer 103 >59 mL/min/1.73    Comment: **[facility] currently reports eGFR in compliance with the current**   recommendations of the [employer]. [facility] will   update reporting as  new guidelines are published from the NKF-ASN   Task force.    BUN/Creatinine Ratio 20 12 - 28   Sodium 144 134 - 144 mmol/L   Potassium 3.9 3.5 - 5.2 mmol/L   Chloride 104 96 - 106 mmol/L   CO2 24 20 - 29 mmol/L   Calcium 9.4 8.7 - 10.3 mg/dL   Total Protein 7.0 6.0 - 8.5 g/dL   Albumin 4.3 3.8 - 4.8 g/dL   Globulin, Total 2.7 1.5 - 4.5 g/dL   Albumin/Globulin Ratio 1.6 1.2 - 2.2   Bilirubin Total 0.9 0.0 - 1.2 mg/dL   Alkaline Phosphatase 68 39 - 117 IU/L   AST 15 0 - 40 IU/L   ALT 17 0 - 32 IU/L  Lipid panel     Status: None   Collection Time: 09/13/19  8:12 AM  Result Value Ref Range   Cholesterol, Total 165 100 - 199 mg/dL   Triglycerides 71 0 - 149 mg/dL   HDL 68 >39 mg/dL   VLDL Cholesterol Cal 14 5 - 40 mg/dL   LDL Chol Calc (NIH) 83 0 - 99 mg/dL   Chol/HDL Ratio 2.4 0.0 - 4.4 ratio    Comment:                                   T. Chol/HDL Ratio                                             Men  Women                               1/2 Avg.Risk  3.4    3.3                                   Avg.Risk  5.0    4.4                                2X Avg.Risk  9.6    7.1                                3X Avg.Risk 23.4   11.0   HgB A1c     Status: Abnormal   Collection Time: 09/13/19  8:12 AM  Result Value Ref Range   Hgb A1c MFr Bld 5.7 (H) 4.8 - 5.6 %    Comment:          Prediabetes: 5.7 - 6.4          Diabetes: >6.4          Glycemic control for adults with diabetes: <7.0    Est. average glucose Bld gHb Est-mCnc 117 mg/dL  CBC with Differential/Platelet     Status: None   Collection Time: 09/13/19  8:12 AM  Result Value Ref Range   WBC 5.6 3.4 - 10.8 x10E3/uL   RBC 4.68 3.77 - 5.28 x10E6/uL   Hemoglobin 14.9 11.1 - 15.9 g/dL   Hematocrit 44.3 34.0 - 46.6 %   MCV 95 79  - 97 fL  MCH 31.8 26.6 - 33.0 pg   MCHC 33.6 31.5 - 35.7 g/dL   RDW 11.9 11.7 - 15.4 %   Platelets 308 150 - 450 x10E3/uL   Neutrophils 59 Not Estab. %   Lymphs 31 Not Estab. %   Monocytes 8 Not Estab. %   Eos 1 Not Estab. %   Basos 1 Not Estab. %   Neutrophils Absolute 3.3 1.4 - 7.0 x10E3/uL   Lymphocytes Absolute 1.7 0.7 - 3.1 x10E3/uL   Monocytes Absolute 0.4 0.1 - 0.9 x10E3/uL   EOS (ABSOLUTE) 0.1 0.0 - 0.4 x10E3/uL   Basophils Absolute 0.0 0.0 - 0.2 x10E3/uL   Immature Granulocytes 0 Not Estab. %   Immature Grans (Abs) 0.0 0.0 - 0.1 x10E3/uL   Objective  Body mass index is 28.53 kg/m. Wt Readings from Last 3 Encounters:  09/25/19 156 lb (70.8 kg)  05/21/19 167 lb (75.8 kg)  02/13/19 189 lb 6.4 oz (85.9 kg)   Temp Readings from Last 3 Encounters:  09/25/19 (!) 97 F (36.1 C) (Temporal)  02/13/19 (!) 97.3 F (36.3 C) (Oral)  01/02/19 (!) 95.3 F (35.2 C) (Temporal)   BP Readings from Last 3 Encounters:  09/25/19 126/78  02/13/19 132/76  01/02/19 130/78   Pulse Readings from Last 3 Encounters:  09/25/19 83  02/13/19 96  01/02/19 (!) 104    Physical Exam Vitals and nursing note reviewed.  Constitutional:      Appearance: Normal appearance. She is well-developed, well-groomed and overweight.  HENT:     Head: Normocephalic and atraumatic.     Ears:     Comments: Cerumen impaction right ear Eyes:     Conjunctiva/sclera: Conjunctivae normal.     Pupils: Pupils are equal, round, and reactive to light.  Cardiovascular:     Rate and Rhythm: Normal rate and regular rhythm.     Heart sounds: Normal heart sounds. No murmur.  Pulmonary:     Effort: Pulmonary effort is normal.     Breath sounds: Normal breath sounds.  Skin:    General: Skin is warm and dry.  Neurological:     General: No focal deficit present.     Mental Status: She is alert and oriented to person, place, and time. Mental status is at baseline.     Gait: Gait normal.  Psychiatric:         Attention and Perception: Attention and perception normal.        Mood and Affect: Mood and affect normal.        Speech: Speech normal.        Behavior: Behavior normal. Behavior is cooperative.        Thought Content: Thought content normal.        Cognition and Memory: Cognition normal.        Judgment: Judgment normal.     Assessment  Plan  Type 2 diabetes mellitus with hyperglycemia, without long-term current use of insulin (HCC) - Plan: Urinalysis, Routine w reflex microscopic, Hemoglobin A1c, Microalbumin / creatinine urine ratio D/c amaryl 1 mg qd  Cont januvia 100 mg qd  Continue healthy diet and exercise   Abrasion of toe of left foot, initial encounter - Plan: mupirocin ointment (BACTROBAN) 2 %  Anxiety with flying - Plan: diazepam (VALIUM) 5 MG tablet prn   Essential hypertension - Plan: Comprehensive metabolic panel, Lipid panel, CBC w/Diff Cont meds hctz   Fatty liver 1/2 twinrix given today RN visit   HM Flu shotutd -rec pna 23  repeatin futurewill get prevnar 1st in 07/2019 -rec twinrix x 3 doses with h/o fatty liver and check titer in 2022 with labs  -given 1/2 twinrix 09/25/19  -recTdap 2/2 covid 19 vaccine had shingrix2/2 Declines MMR vx   S/p hysterectomy DUB no h/o abnormal pap no need further pap mammo neg9/30/20ordered 01/2020  Colonoscopy 07/07/16 tubular adenoma neg path repeat in 5 years  DEXA 07/10/19 normal   Hep C neg 02/24/15 Skin no current issues reassess each visit  Pulm [facility]. Delma Freeze [facility] [place]  [facility] dentist  Former PCP Dr. Arbutus Ped MD 12/21/17 last seen   [facility] mail order refill  [place] Provider: Dr. Olivia Mackie McLean-Scocuzza-Internal Medicine

## 2019-09-25 NOTE — Patient Instructions (Addendum)
Nature Made melatonin XR or L theanine 100-200 mg   Bonnine/Dramamine otc for flying   Debrox ear wax drops  Fasting labs in 6 months 1 week prior to f/u appt  Restless Legs Syndrome Restless legs syndrome is a condition that causes uncomfortable feelings or sensations in the legs, especially while sitting or lying down. The sensations usually cause an overwhelming urge to move the legs. The arms can also sometimes be affected. The condition can range from mild to severe. The symptoms often interfere with a person's ability to sleep. What are the causes? The cause of this condition is not known. What increases the risk? The following factors may make you more likely to develop this condition:  Being older than 50.  Pregnancy.  Being a woman. In general, the condition is more common in women than in men.  A family history of the condition.  Having iron deficiency.  Overuse of caffeine, nicotine, or alcohol.  Certain medical conditions, such as kidney disease, Parkinson's disease, or nerve damage.  Certain medicines, such as those for high blood pressure, nausea, colds, allergies, depression, and some heart conditions. What are the signs or symptoms? The main symptom of this condition is uncomfortable sensations in the legs, such as:  Pulling.  Tingling.  Prickling.  Throbbing.  Crawling.  Burning. Usually, the sensations:  Affect both sides of the body.  Are worse when you sit or lie down.  Are worse at night. These may wake you up or make it difficult to fall asleep.  Make you have a strong urge to move your legs.  Are temporarily relieved by moving your legs. The arms can also be affected, but this is rare. People who have this condition often have tiredness during the day because of their lack of sleep at night. How is this diagnosed? This condition may be diagnosed based on:  Your symptoms.  Blood tests. In some cases, you may be monitored in a sleep  lab by a specialist (a sleep study). This can detect any disruptions in your sleep. How is this treated? This condition is treated by managing the symptoms. This may include:  Lifestyle changes, such as exercising, using relaxation techniques, and avoiding caffeine, alcohol, or tobacco.  Medicines. Anti-seizure medicines may be tried first. Follow these instructions at home:     General instructions  Take over-the-counter and prescription medicines only as told by your health care provider.  Use methods to help relieve the uncomfortable sensations, such as: ? Massaging your legs. ? Walking or stretching. ? Taking a cold or hot bath.  Keep all follow-up visits as told by your health care provider. This is important. Lifestyle  Practice good sleep habits. For example, go to bed and get up at the same time every day. Most adults should get 7-9 hours of sleep each night.  Exercise regularly. Try to get at least 30 minutes of exercise most days of the week.  Practice ways of relaxing, such as yoga or meditation.  Avoid caffeine and alcohol.  Do not use any products that contain nicotine or tobacco, such as cigarettes and e-cigarettes. If you need help quitting, ask your health care provider. Contact a health care provider if:  Your symptoms get worse or they do not improve with treatment. Summary  Restless legs syndrome is a condition that causes uncomfortable feelings or sensations in the legs, especially while sitting or lying down.  The symptoms often interfere with a person's ability to sleep.  This condition  is treated by managing the symptoms. You may need to make lifestyle changes or take medicines. This information is not intended to replace advice given to you by your health care provider. Make sure you discuss any questions you have with your health care provider. Document Revised: 05/15/2017 Document Reviewed: 05/15/2017 Elsevier Patient Education  [place].

## 2019-09-25 NOTE — Progress Notes (Signed)
Patient presented for twinrix injection to left deltoid, patient voiced no concerns nor showed any signs of distress during injection. 

## 2019-10-14 ENCOUNTER — Telehealth: Payer: Self-pay | Admitting: Internal Medicine

## 2019-10-14 NOTE — Telephone Encounter (Signed)
-----   Message from Delorise Jackson, MD sent at 09/25/2019  1:41 PM EDT ----- Call [facility] and d/c amaryl 1 mg, singular 10 mg qd, albuterol inhaler ThanksTMS

## 2019-10-14 NOTE — Telephone Encounter (Signed)
Called an confirmed that these medications have been discontinued.

## 2019-10-18 ENCOUNTER — Other Ambulatory Visit: Payer: Self-pay | Admitting: Internal Medicine

## 2019-10-18 ENCOUNTER — Telehealth: Payer: Self-pay | Admitting: Internal Medicine

## 2019-10-18 DIAGNOSIS — J45909 Unspecified asthma, uncomplicated: Secondary | ICD-10-CM

## 2019-10-18 DIAGNOSIS — J309 Allergic rhinitis, unspecified: Secondary | ICD-10-CM

## 2019-10-18 MED ORDER — FLUTICASONE FUROATE-VILANTEROL 200-25 MCG/INH IN AEPB: 1.0000 | INHALATION_SPRAY | Freq: Every day | RESPIRATORY_TRACT | 1 refills | Status: DC

## 2019-10-18 MED ORDER — FLUTICASONE PROPIONATE 50 MCG/ACT NA SUSP: 2.0000 | Freq: Every day | NASAL | 3 refills | Status: DC | PRN

## 2019-10-18 NOTE — Telephone Encounter (Signed)
Pt called in and stated that the pharmacy said that her doctor deined the refill on her fluticasone furoate-vilanterol (BREO ELLIPTA) 200-25 MCG/INH AEPB and Cholecalciferol 1.25 MG (50000 UT) capsule

## 2019-10-18 NOTE — Telephone Encounter (Signed)
Left message to return call 

## 2019-10-18 NOTE — Telephone Encounter (Signed)
Vitamin D3 can she can take 2000 to 4000 IU daily not weekly D3   Sent breo/flonase to [facility] I did not deny this

## 2019-10-21 ENCOUNTER — Telehealth: Payer: Self-pay

## 2019-10-21 NOTE — Telephone Encounter (Signed)
Patient called to ask about Breo Ellipta medication. Scarlett states that [facility] delivery said that we did not order the medicine. I informed her that the medication was confirmed received by the pharmacy on 10/18/19 at 3:05pm through e-prescription. Brinklee states that she will contact her pharmacy.

## 2019-10-21 NOTE — Telephone Encounter (Addendum)
Left message to return call. Also informed that mychart message will be sent.

## 2019-10-30 ENCOUNTER — Other Ambulatory Visit: Payer: Self-pay

## 2019-10-30 ENCOUNTER — Ambulatory Visit (INDEPENDENT_AMBULATORY_CARE_PROVIDER_SITE_OTHER): Payer: 59

## 2019-10-30 DIAGNOSIS — Z23 Encounter for immunization: Secondary | ICD-10-CM

## 2019-10-30 NOTE — Progress Notes (Signed)
Patient presented for Twinrix injection to left deltoid, patient voiced no concerns nor showed any signs of distress during injection. 

## 2019-12-01 ENCOUNTER — Other Ambulatory Visit: Payer: Self-pay | Admitting: Internal Medicine

## 2019-12-01 DIAGNOSIS — E876 Hypokalemia: Secondary | ICD-10-CM

## 2019-12-01 DIAGNOSIS — I10 Essential (primary) hypertension: Secondary | ICD-10-CM

## 2019-12-01 MED ORDER — HYDROCHLOROTHIAZIDE 25 MG PO TABS: 25.0000 mg | ORAL_TABLET | Freq: Every day | ORAL | 3 refills | Status: DC

## 2019-12-01 MED ORDER — POTASSIUM CHLORIDE ER 10 MEQ PO TBCR: 10.0000 meq | EXTENDED_RELEASE_TABLET | Freq: Every day | ORAL | 3 refills | Status: DC

## 2019-12-12 IMAGING — DX DG ABDOMEN 2V
3 series · 3 of 3 positions shown · non-contrast
Comparison: None.

CLINICAL DATA: Lower abdominal pain. Constipation.

EXAM:
ABDOMEN - 2 VIEW

[abdomen erect]
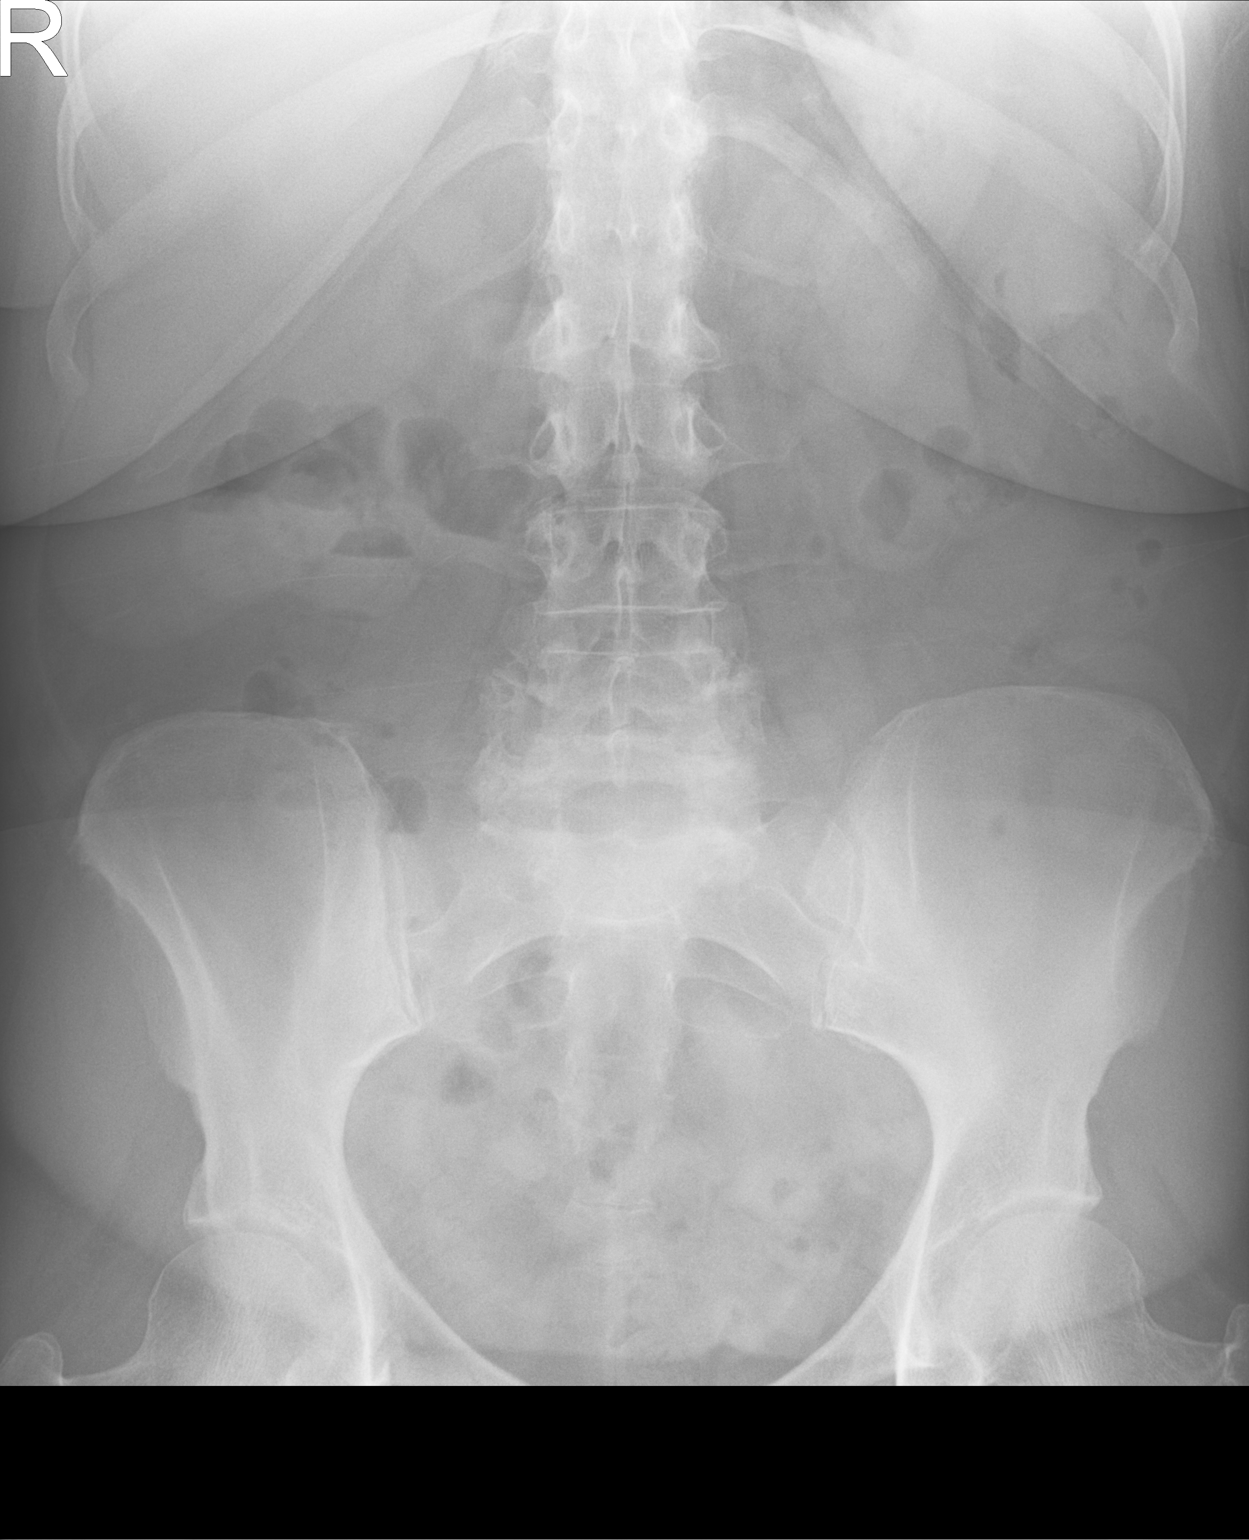

[abdomen supine (1 of 2)]
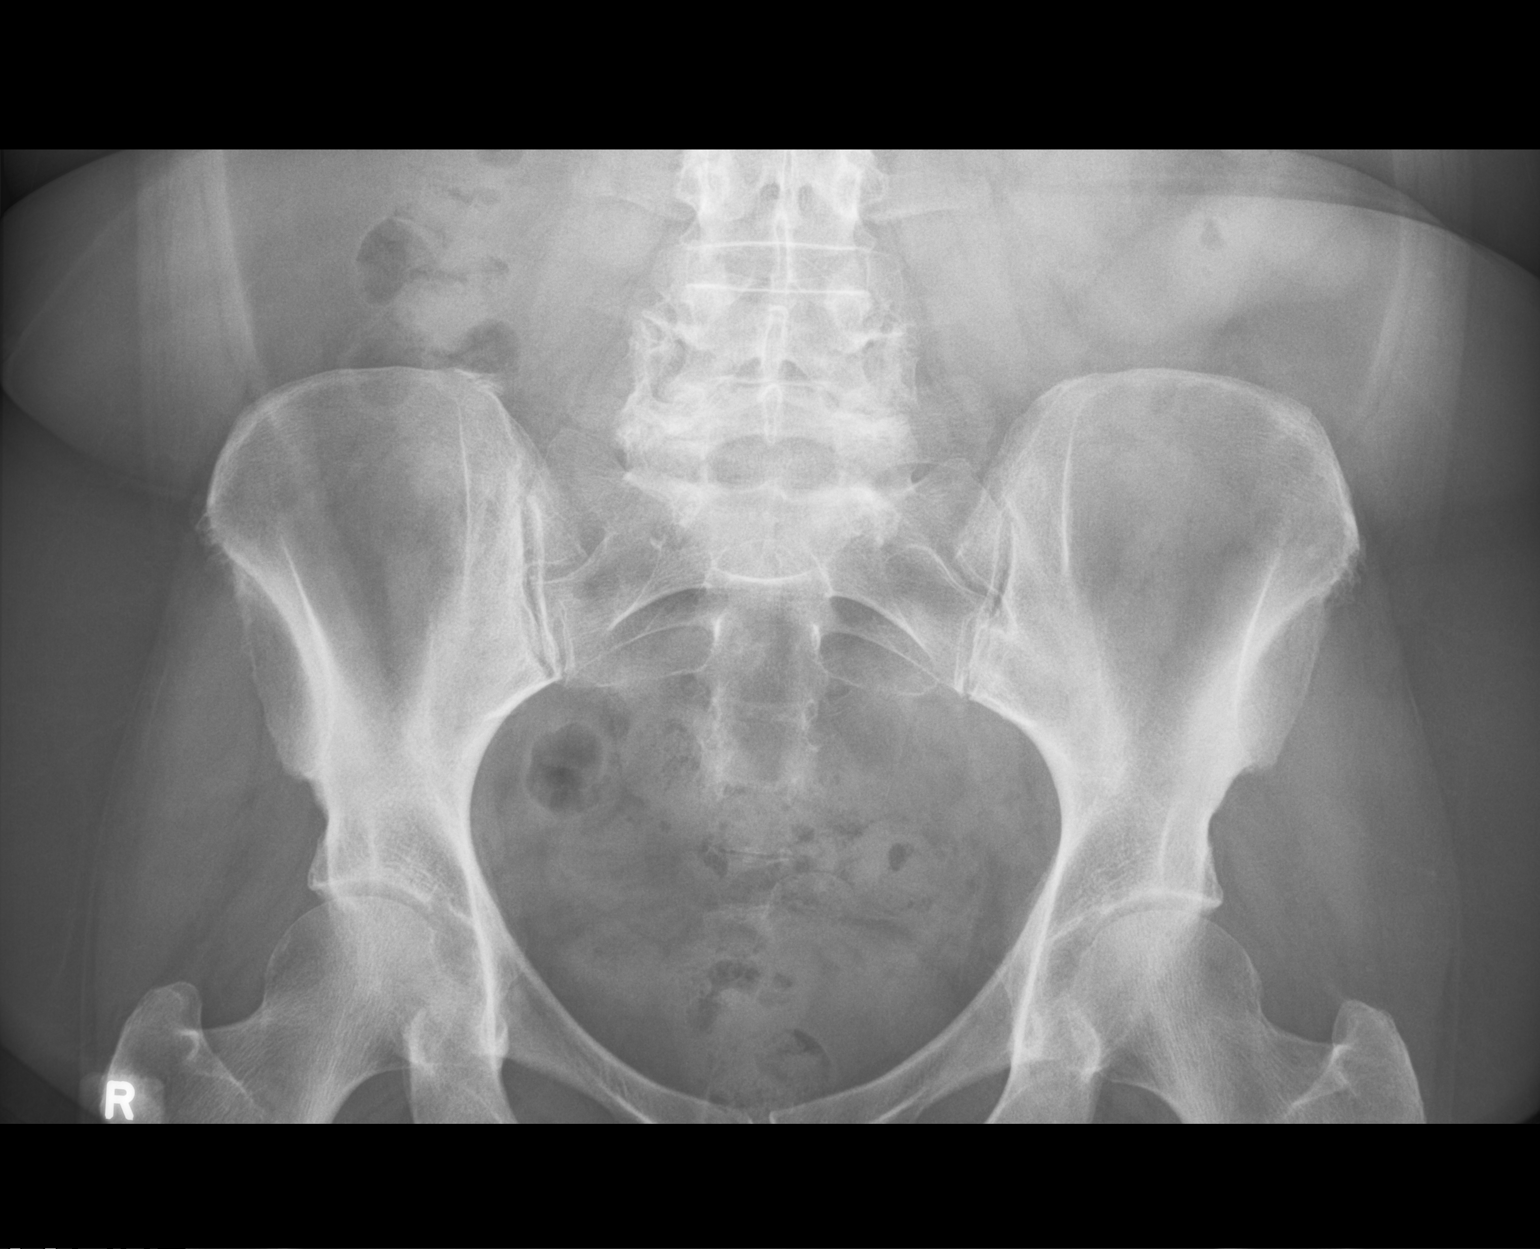

[abdomen supine (2 of 2)]
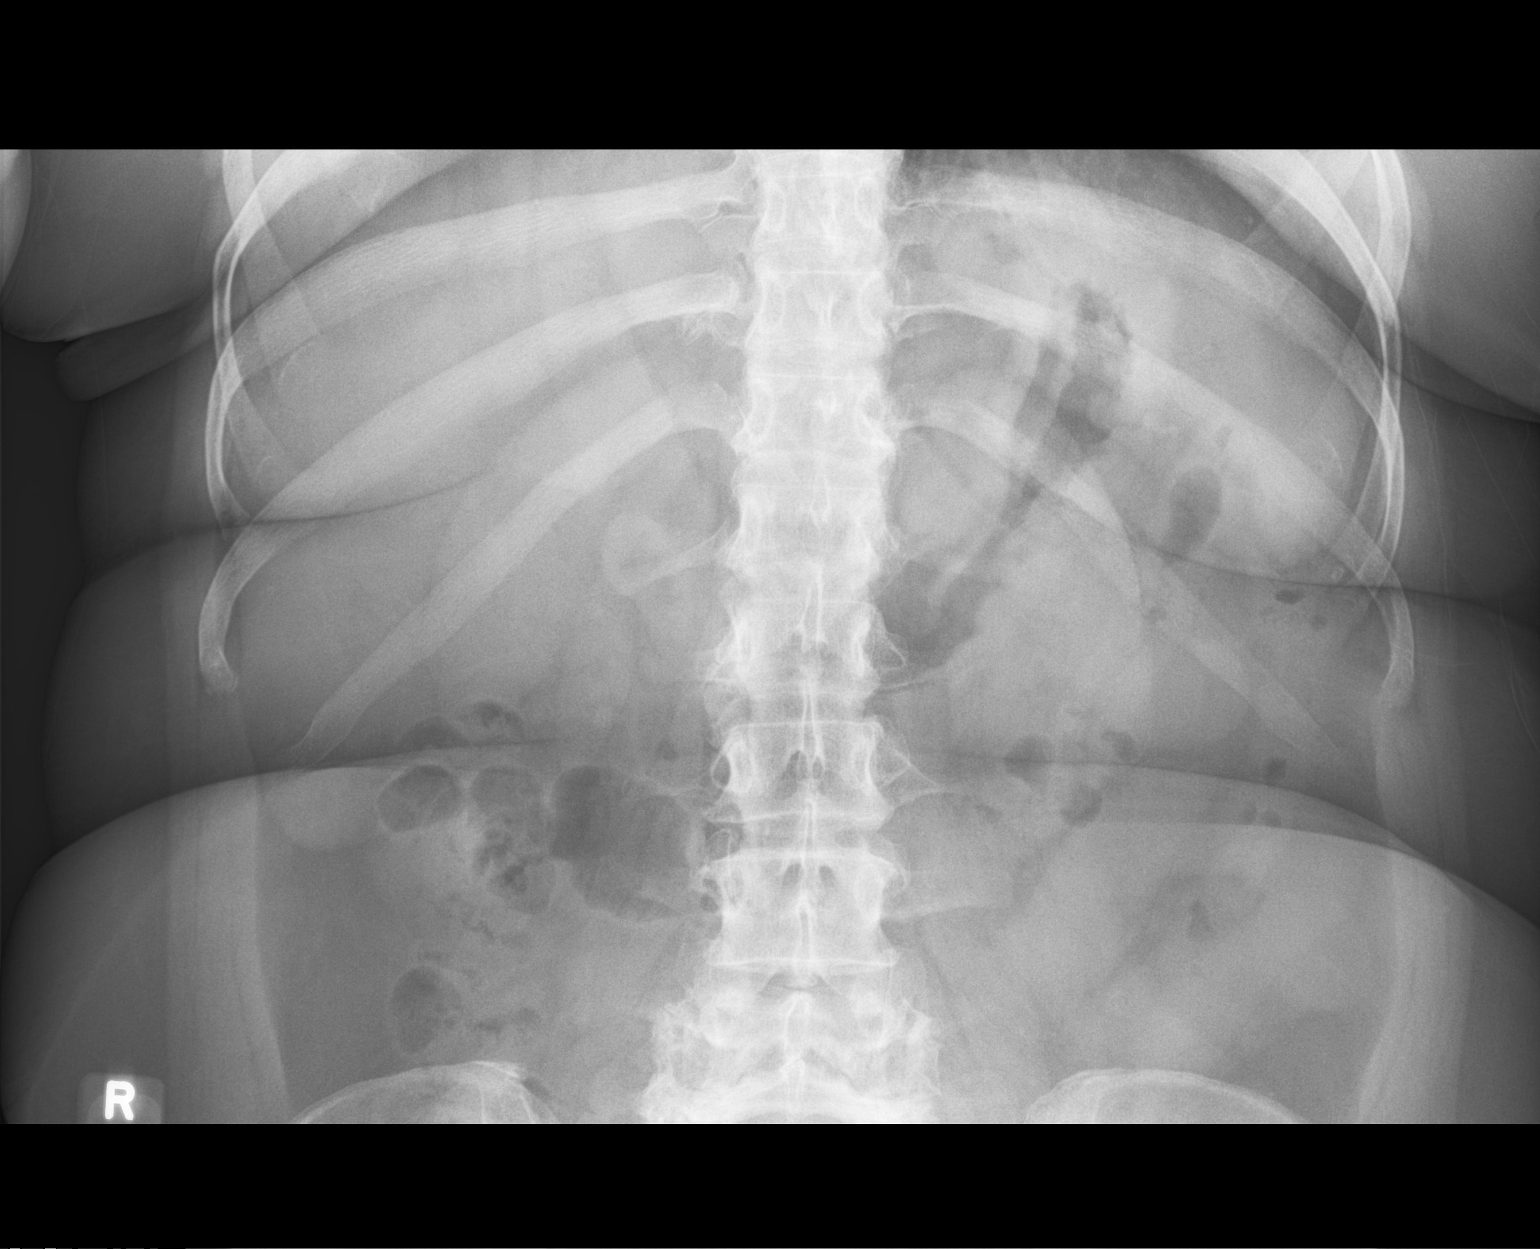

[3 of 3 positions shown; findings below may reference images not displayed]

FINDINGS: The bowel gas pattern is normal. There is no evidence of free air.
Small amount of stool noted. No radio-opaque calculi or other
significant radiographic abnormality is seen.
IMPRESSION: Unremarkable bowel gas pattern.  No acute findings.

## 2019-12-23 ENCOUNTER — Ambulatory Visit
Admission: EM | Admit: 2019-12-23 | Discharge: 2019-12-23 | Disposition: A | Discharge status: Home / Self Care | Payer: 59 | Attending: Emergency Medicine | Admitting: Emergency Medicine

## 2019-12-23 ENCOUNTER — Telehealth: Payer: Self-pay | Admitting: Internal Medicine

## 2019-12-23 DIAGNOSIS — R002 Palpitations: Secondary | ICD-10-CM | POA: Diagnosis not present

## 2019-12-23 NOTE — Discharge Instructions (Signed)
Go to the emergency department if you have acute worsening symptoms or develop new symptoms such as chest pain.    Schedule a follow-up appointment with your primary care provider soon as possible.

## 2019-12-23 NOTE — Telephone Encounter (Signed)
Patient is currently in an UC.

## 2019-12-23 NOTE — ED Provider Notes (Signed)
[name]    CSN: [id number] Arrival date & time: 12/23/19  1557      History   Chief Complaint Chief Complaint  Patient presents with  . Palpitations    HPI Jessica Snyder is a 64 y.o. female.   Patient presents with 1 week history of palpitations at night when she lies down to go to sleep.  She describes this as a sensation of fluttering.  She is currently asymptomatic and states she does not have this feeling during the day.  She also reports fatigue x several months.  She denies chest pain, shortness of breath, focal weakness, headache, dizziness, facial droop, LE edema, abdominal pain, or other symptoms.  No treatment attempted at home.  Patient's medical history includes hypertension and diabetes.  The history is provided by the patient.    Past Medical History:  Diagnosis Date  . Allergy   . Arthritis    hands   . Asthma   . Colon polyps   . Depression   . Diabetes mellitus without complication ([place])   . Diverticulitis   . Fatty liver   . History of chicken pox   . Hyperlipidemia   . Hypertension     Patient Active Problem List   Diagnosis Date Noted  . 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 Fatty liver 02/13/2019  . Elevated liver function tests 02/13/2019  . Leg cramps 09/04/2018  . Lower abdominal pain 05/18/2018  . Vitamin D deficiency 05/11/2018  . Asthma 05/04/2018  . Osteoarthritis 05/04/2018  . Diverticulosis 05/04/2018  . Depression 05/04/2018  . DM2 (diabetes mellitus, type 2) ([place]) 05/04/2018  . HTN (hypertension) 05/04/2018  . Constipation 05/04/2018    Past Surgical History:  Procedure Laterality Date  . ABDOMINAL HYSTERECTOMY     DUB ovaries intact s/p bladder suspension/sling no h/o abnormal pap   . CATARACT EXTRACTION, BILATERAL     right 03/29/19 and 04/14/19 Left     OB History   No obstetric history on file.      Home Medications    Prior to Admission medications   Medication Sig Start Date End Date Taking?  Authorizing Provider  amLODipine (NORVASC) 10 MG tablet Take 1 tablet (10 mg total) by mouth daily. 04/23/19   McLean-Scocuzza, Nino Glow, MD  aspirin EC 81 MG tablet Take by mouth daily.     [provider]  atorvastatin (LIPITOR) 20 MG tablet TAKE 1 TABLET ONE TIME DAILY at night 05/01/19   McLean-Scocuzza, Nino Glow, MD  diazepam (VALIUM) 5 MG tablet Take 1-2 tablets (5-10 mg total) by mouth every 12 (twelve) hours as needed for anxiety. 09/25/19   McLean-Scocuzza, Nino Glow, MD  diphenhydrAMINE-APAP, sleep, (TYLENOL PM EXTRA STRENGTH PO) Take by mouth.    [provider]  fluticasone (FLONASE) 50 MCG/ACT nasal spray Place 2 sprays into both nostrils daily as needed for allergies or rhinitis. 10/18/19   McLean-Scocuzza, Nino Glow, MD  fluticasone furoate-vilanterol (BREO ELLIPTA) 200-25 MCG/INH AEPB Inhale 1 puff into the lungs daily. Rinse mouth with use 10/18/19   McLean-Scocuzza, Nino Glow, MD  hydrochlorothiazide (HYDRODIURIL) 25 MG tablet Take 1 tablet (25 mg total) by mouth daily. In am 12/01/19   McLean-Scocuzza, Nino Glow, MD  Lancets (FREESTYLE) lancets Bid Use as instructed e11.9 07/25/19   McLean-Scocuzza, Nino Glow, MD  loratadine (CLARITIN) 10 MG tablet Take 10 mg by mouth daily.    [provider]  losartan (COZAAR) 100 MG tablet Take 1 tablet (100 mg total) by  mouth daily. 04/23/19   McLean-Scocuzza, Nino Glow, MD  mupirocin ointment (BACTROBAN) 2 % Apply 1 application topically 3 (three) times daily. Left great toe 09/25/19   McLean-Scocuzza, Nino Glow, MD  potassium chloride (KLOR-CON) 10 MEQ tablet Take 1 tablet (10 mEq total) by mouth daily. 12/01/19   McLean-Scocuzza, Nino Glow, MD  sertraline (ZOLOFT) 100 MG tablet TAKE 1 TABLET EVERY DAY 03/14/14   [provider]  sitaGLIPtin (JANUVIA) 100 MG tablet Take 1 tablet (100 mg total) by mouth daily. 02/13/19   McLean-Scocuzza, Nino Glow, MD    Family History Family History  Problem Relation Age of Onset  . Arthritis  Mother   . Depression Mother   . Diabetes Mother   . Heart disease Mother        MI, CHF  . Hyperlipidemia Mother   . Hypertension Mother   . Diabetes Father   . Heart disease Father   . Stroke Father   . Cancer Sister        glioblastoma  . Depression Sister   . Diabetes Sister   . Hyperlipidemia Sister   . Hypertension Sister   . Mental illness Sister   . Diabetes Son        type 1  . Heart disease Maternal Grandmother   . Heart disease Maternal Grandfather     Social History Social History   Tobacco Use  . Smoking status: Former Research scientist (life sciences)  . Smokeless tobacco: Never Used  . Tobacco comment: quit age 63 y.o duration 12-15 years 1 ppd   Substance Use Topics  . Alcohol use: Yes  . Drug use: Not Currently     Allergies   Amoxicillin-pot clavulanate, Ciprofloxacin, and Metformin   Review of Systems Review of Systems  Constitutional: Positive for fatigue. Negative for chills, diaphoresis and fever.  HENT: Negative for ear pain and sore throat.   Eyes: Negative for pain and visual disturbance.  Respiratory: Negative for cough and shortness of breath.   Cardiovascular: Positive for palpitations. Negative for chest pain and leg swelling.  Gastrointestinal: Negative for abdominal pain, nausea and vomiting.  Genitourinary: Negative for dysuria and hematuria.  Musculoskeletal: Negative for arthralgias and back pain.  Skin: Negative for color change and rash.  Neurological: Negative for dizziness, tremors, seizures, syncope, facial asymmetry, speech difficulty, weakness, light-headedness, numbness and headaches.  All other systems reviewed and are negative.    Physical Exam Triage Vital Signs ED Triage Vitals [12/23/19 1558]  Enc Vitals Group     BP      Pulse      Resp      Temp      Temp src      SpO2      Weight      Height      Head Circumference      Peak Flow      Pain Score 0     Pain Loc      Pain Edu?      Excl. in [place]?    No data  found.  Updated Vital Signs BP 121/83   Pulse 69   Temp 98.9 F (37.2 C)   Resp 13   SpO2 96%   Visual Acuity Right Eye Distance:   Left Eye Distance:   Bilateral Distance:    Right Eye Near:   Left Eye Near:    Bilateral Near:     Physical Exam Vitals and nursing note reviewed.  Constitutional:      General:  She is not in acute distress.    Appearance: She is well-developed. She is not ill-appearing.  HENT:     Head: Normocephalic and atraumatic.     Mouth/Throat:     Mouth: Mucous membranes are moist.     Pharynx: Oropharynx is clear.  Eyes:     Conjunctiva/sclera: Conjunctivae normal.  Cardiovascular:     Rate and Rhythm: Normal rate and regular rhythm.     Heart sounds: Normal heart sounds. No murmur heard.   Pulmonary:     Effort: Pulmonary effort is normal. No respiratory distress.     Breath sounds: Normal breath sounds. No wheezing, rhonchi or rales.  Abdominal:     Palpations: Abdomen is soft.     Tenderness: There is no abdominal tenderness. There is no guarding or rebound.  Musculoskeletal:     Cervical back: Neck supple.     Right lower leg: No edema.     Left lower leg: No edema.  Skin:    General: Skin is warm and dry.     Findings: No rash.  Neurological:     General: No focal deficit present.     Mental Status: She is alert and oriented to person, place, and time.     Sensory: No sensory deficit.     Motor: No weakness.     Coordination: Coordination normal.     Gait: Gait normal.  Psychiatric:        Mood and Affect: Mood normal.        Behavior: Behavior normal.      UC Treatments / Results  Labs (all labs ordered are listed, but only abnormal results are displayed) Labs Reviewed - No data to display  EKG   Radiology No results found.  Procedures Procedures (including critical care time)  Medications Ordered in UC Medications - No data to display  Initial Impression / Assessment and Plan / UC Course  I have reviewed the  triage vital signs and the nursing notes.  Pertinent labs & imaging results that were available during my care of the patient were reviewed by me and considered in my medical decision making (see chart for details).   Palpitations.  Discussed limitations of urgent care versus emergency department with patient at length.  She declines transfer to the ED.  EKG shows sinus rhythm, rate 64, no ST elevation, no previous to compare.  Instructed patient to schedule a follow-up appointment with her PCP as soon as possible.  Strict instructions for going to the ED if she has acute worsening symptoms or develops new symptoms such as chest pain.  Patient agrees to plan of care.   Final Clinical Impressions(s) / UC Diagnoses   Final diagnoses:  Palpitation     Discharge Instructions     Go to the emergency department if you have acute worsening symptoms or develop new symptoms such as chest pain.    Schedule a follow-up appointment with your primary care provider soon as possible.    ED Prescriptions    None     PDMP not reviewed this encounter.   Sharion Balloon, NP 12/23/19 [phone]

## 2019-12-23 NOTE — ED Triage Notes (Signed)
Patient reports she feels like her heart is fluttering mainly when she lays down at night to go to sleep. PCP was unable to get her an appointment so referred her to [facility].

## 2019-12-23 NOTE — Telephone Encounter (Signed)
Pt called in and said she was fatigued and her heart has been fluttering for the past week. I sent patient to access nurse to be triaged.

## 2019-12-25 ENCOUNTER — Ambulatory Visit: Payer: 59 | Admitting: Internal Medicine

## 2020-01-15 ENCOUNTER — Other Ambulatory Visit: Payer: Self-pay

## 2020-01-15 DIAGNOSIS — E1165 Type 2 diabetes mellitus with hyperglycemia: Secondary | ICD-10-CM

## 2020-01-15 MED ORDER — SITAGLIPTIN PHOSPHATE 100 MG PO TABS: 100.0000 mg | ORAL_TABLET | Freq: Every day | ORAL | 1 refills | Status: DC

## 2020-01-20 ENCOUNTER — Other Ambulatory Visit: Payer: Self-pay | Admitting: Internal Medicine

## 2020-01-20 DIAGNOSIS — E1165 Type 2 diabetes mellitus with hyperglycemia: Secondary | ICD-10-CM

## 2020-01-20 MED ORDER — SITAGLIPTIN PHOSPHATE 100 MG PO TABS: 100.0000 mg | ORAL_TABLET | Freq: Every day | ORAL | 3 refills | Status: DC

## 2020-03-11 ENCOUNTER — Other Ambulatory Visit: Payer: Self-pay

## 2020-03-11 DIAGNOSIS — I10 Essential (primary) hypertension: Secondary | ICD-10-CM

## 2020-03-11 MED ORDER — AMLODIPINE BESYLATE 10 MG PO TABS: 10.0000 mg | ORAL_TABLET | Freq: Every day | ORAL | 3 refills | Status: DC

## 2020-03-12 ENCOUNTER — Other Ambulatory Visit: Payer: Self-pay

## 2020-03-12 DIAGNOSIS — E785 Hyperlipidemia, unspecified: Secondary | ICD-10-CM

## 2020-03-12 DIAGNOSIS — I10 Essential (primary) hypertension: Secondary | ICD-10-CM

## 2020-03-12 MED ORDER — ATORVASTATIN CALCIUM 20 MG PO TABS: ORAL_TABLET | ORAL | 3 refills | Status: DC

## 2020-03-12 MED ORDER — LOSARTAN POTASSIUM 100 MG PO TABS: 100.0000 mg | ORAL_TABLET | Freq: Every day | ORAL | 3 refills | Status: DC

## 2020-03-19 LAB — HM DIABETES EYE EXAM

## 2020-03-25 ENCOUNTER — Encounter: Payer: Self-pay | Admitting: *Deleted

## 2020-03-25 ENCOUNTER — Other Ambulatory Visit: Payer: Self-pay

## 2020-03-25 LAB — CBC WITH DIFFERENTIAL/PLATELET
Basophils Absolute: 0 10*3/uL (ref 0.0–0.2)
Basos: 0 %
EOS (ABSOLUTE): 0.1 10*3/uL (ref 0.0–0.4)
Eos: 1 %
Hematocrit: 44.6 % (ref 34.0–46.6)
Hemoglobin: 15 g/dL (ref 11.1–15.9)
Immature Grans (Abs): 0 10*3/uL (ref 0.0–0.1)
Immature Granulocytes: 0 %
Lymphocytes Absolute: 1.8 10*3/uL (ref 0.7–3.1)
Lymphs: 27 %
MCH: 32.3 pg (ref 26.6–33.0)
MCHC: 33.6 g/dL (ref 31.5–35.7)
MCV: 96 fL (ref 79–97)
Monocytes Absolute: 0.4 10*3/uL (ref 0.1–0.9)
Monocytes: 6 %
Neutrophils Absolute: 4.4 10*3/uL (ref 1.4–7.0)
Neutrophils: 66 %
Platelets: 298 10*3/uL (ref 150–450)
RBC: 4.64 x10E6/uL (ref 3.77–5.28)
RDW: 11.9 % (ref 11.7–15.4)
WBC: 6.7 10*3/uL (ref 3.4–10.8)

## 2020-03-25 LAB — LIPID PANEL
Chol/HDL Ratio: 2.8 ratio (ref 0.0–4.4)
Cholesterol, Total: 214 mg/dL — ABNORMAL HIGH (ref 100–199)
HDL: 77 mg/dL (ref >39)
LDL Chol Calc (NIH): 114 mg/dL — ABNORMAL HIGH (ref 0–99)
Triglycerides: 132 mg/dL (ref 0–149)
VLDL Cholesterol Cal: 23 mg/dL (ref 5–40)

## 2020-03-25 LAB — COMPREHENSIVE METABOLIC PANEL
ALT: 21 IU/L (ref 0–32)
AST: 20 IU/L (ref 0–40)
Albumin/Globulin Ratio: 1.8 (ref 1.2–2.2)
Albumin: 4.6 g/dL (ref 3.8–4.8)
Alkaline Phosphatase: 78 IU/L (ref 44–121)
BUN/Creatinine Ratio: 20 (ref 12–28)
BUN: 13 mg/dL (ref 8–27)
Bilirubin Total: 0.9 mg/dL (ref 0.0–1.2)
CO2: 23 mmol/L (ref 20–29)
Calcium: 9.7 mg/dL (ref 8.7–10.3)
Chloride: 100 mmol/L (ref 96–106)
Creatinine, Ser: 0.66 mg/dL (ref 0.57–1.00)
GFR calc Af Amer: 107 mL/min/{1.73_m2} (ref >59)
GFR calc non Af Amer: 93 mL/min/{1.73_m2} (ref >59)
Globulin, Total: 2.6 g/dL (ref 1.5–4.5)
Glucose: 124 mg/dL — ABNORMAL HIGH (ref 65–99)
Potassium: 4.2 mmol/L (ref 3.5–5.2)
Sodium: 140 mmol/L (ref 134–144)
Total Protein: 7.2 g/dL (ref 6.0–8.5)

## 2020-03-25 LAB — MICROSCOPIC EXAMINATION
Bacteria, UA: NONE SEEN
Casts: NONE SEEN /lpf
RBC, Urine: NONE SEEN /hpf (ref 0–2)

## 2020-03-25 LAB — URINALYSIS, ROUTINE W REFLEX MICROSCOPIC
Bilirubin, UA: NEGATIVE
Glucose, UA: NEGATIVE
Ketones, UA: NEGATIVE
Nitrite, UA: NEGATIVE
Protein,UA: NEGATIVE
RBC, UA: NEGATIVE
Specific Gravity, UA: 1.016 (ref 1.005–1.030)
Urobilinogen, Ur: 0.2 mg/dL (ref 0.2–1.0)
pH, UA: 6.5 (ref 5.0–7.5)

## 2020-03-25 LAB — MICROALBUMIN / CREATININE URINE RATIO
Creatinine, Urine: 32.6 mg/dL
Microalb/Creat Ratio: <9 mg/g creat (ref 0–29)
Microalbumin, Urine: <3 ug/mL

## 2020-03-25 LAB — TSH: TSH: 2.27 u[IU]/mL (ref 0.450–4.500)

## 2020-03-25 LAB — HEMOGLOBIN A1C
Est. average glucose Bld gHb Est-mCnc: 123 mg/dL
Hgb A1c MFr Bld: 5.9 % — ABNORMAL HIGH (ref 4.8–5.6)

## 2020-03-27 ENCOUNTER — Other Ambulatory Visit: Payer: Self-pay

## 2020-03-27 ENCOUNTER — Ambulatory Visit (INDEPENDENT_AMBULATORY_CARE_PROVIDER_SITE_OTHER): Payer: 59 | Admitting: Internal Medicine

## 2020-03-27 ENCOUNTER — Encounter: Payer: Self-pay | Admitting: Internal Medicine

## 2020-03-27 VITALS — BP 130/88 | HR 76 | Temp 97.6°F | Ht 62.0 in | Wt 158.0 lb

## 2020-03-27 DIAGNOSIS — I152 Hypertension secondary to endocrine disorders: Secondary | ICD-10-CM | POA: Insufficient documentation

## 2020-03-27 DIAGNOSIS — R002 Palpitations: Secondary | ICD-10-CM | POA: Diagnosis not present

## 2020-03-27 DIAGNOSIS — E1165 Type 2 diabetes mellitus with hyperglycemia: Secondary | ICD-10-CM

## 2020-03-27 DIAGNOSIS — Z1159 Encounter for screening for other viral diseases: Secondary | ICD-10-CM

## 2020-03-27 DIAGNOSIS — E1159 Type 2 diabetes mellitus with other circulatory complications: Secondary | ICD-10-CM | POA: Diagnosis not present

## 2020-03-27 DIAGNOSIS — F32A Depression, unspecified: Secondary | ICD-10-CM

## 2020-03-27 DIAGNOSIS — E041 Nontoxic single thyroid nodule: Secondary | ICD-10-CM

## 2020-03-27 DIAGNOSIS — E559 Vitamin D deficiency, unspecified: Secondary | ICD-10-CM

## 2020-03-27 DIAGNOSIS — M545 Low back pain, unspecified: Secondary | ICD-10-CM

## 2020-03-27 DIAGNOSIS — F419 Anxiety disorder, unspecified: Secondary | ICD-10-CM

## 2020-03-27 DIAGNOSIS — H6121 Impacted cerumen, right ear: Secondary | ICD-10-CM

## 2020-03-27 DIAGNOSIS — J45909 Unspecified asthma, uncomplicated: Secondary | ICD-10-CM

## 2020-03-27 DIAGNOSIS — R590 Localized enlarged lymph nodes: Secondary | ICD-10-CM

## 2020-03-27 DIAGNOSIS — F439 Reaction to severe stress, unspecified: Secondary | ICD-10-CM | POA: Diagnosis not present

## 2020-03-27 DIAGNOSIS — Z0184 Encounter for antibody response examination: Secondary | ICD-10-CM

## 2020-03-27 DIAGNOSIS — I10 Essential (primary) hypertension: Secondary | ICD-10-CM

## 2020-03-27 DIAGNOSIS — H04123 Dry eye syndrome of bilateral lacrimal glands: Secondary | ICD-10-CM

## 2020-03-27 DIAGNOSIS — F339 Major depressive disorder, recurrent, unspecified: Secondary | ICD-10-CM

## 2020-03-27 DIAGNOSIS — G8929 Other chronic pain: Secondary | ICD-10-CM

## 2020-03-27 MED ORDER — SERTRALINE HCL 100 MG PO TABS: 100.0000 mg | ORAL_TABLET | Freq: Every day | ORAL | Status: DC

## 2020-03-27 MED ORDER — FLUTICASONE FUROATE-VILANTEROL 200-25 MCG/INH IN AEPB: 1.0000 | INHALATION_SPRAY | Freq: Every day | RESPIRATORY_TRACT | 3 refills | Status: DC

## 2020-03-27 NOTE — Patient Instructions (Addendum)
Hep A/B 04/2020, Prevnar 05/2020, 06/2020 Tdap   High Cholesterol  High cholesterol is a condition in which the blood has high levels of a white, waxy, fat-like substance (cholesterol). The human body needs small amounts of cholesterol. The liver makes all the cholesterol that the body needs. Extra (excess) cholesterol comes from the food that we eat. Cholesterol is carried from the liver by the blood through the blood vessels. If you have high cholesterol, deposits (plaques) may build up on the walls of your blood vessels (arteries). Plaques make the arteries narrower and stiffer. Cholesterol plaques increase your risk for heart attack and stroke. Work with your health care provider to keep your cholesterol levels in a healthy range. What increases the risk? This condition is more likely to develop in people who:  Eat foods that are high in animal fat (saturated fat) or cholesterol.  Are overweight.  Are not getting enough exercise.  Have a family history of high cholesterol. What are the signs or symptoms? There are no symptoms of this condition. How is this diagnosed? This condition may be diagnosed from the results of a blood test.  If you are older than age 68, your health care provider may check your cholesterol every 4-6 years.  You may be checked more often if you already have high cholesterol or other risk factors for heart disease. The blood test for cholesterol measures:  "Bad" cholesterol (LDL cholesterol). This is the main type of cholesterol that causes heart disease. The desired level for LDL is less than 100.  "Good" cholesterol (HDL cholesterol). This type helps to protect against heart disease by cleaning the arteries and carrying the LDL away. The desired level for HDL is 60 or higher.  Triglycerides. These are fats that the body can store or burn for energy. The desired number for triglycerides is lower than 150.  Total cholesterol. This is a measure of the total  amount of cholesterol in your blood, including LDL cholesterol, HDL cholesterol, and triglycerides. A healthy number is less than 200. How is this treated? This condition is treated with diet changes, lifestyle changes, and medicines. Diet changes  This may include eating more whole grains, fruits, vegetables, nuts, and fish.  This may also include cutting back on red meat and foods that have a lot of added sugar. Lifestyle changes  Changes may include getting at least 40 minutes of aerobic exercise 3 times a week. Aerobic exercises include walking, biking, and swimming. Aerobic exercise along with a healthy diet can help you maintain a healthy weight.  Changes may also include quitting smoking. Medicines  Medicines are usually given if diet and lifestyle changes have failed to reduce your cholesterol to healthy levels.  Your health care provider may prescribe a statin medicine. Statin medicines have been shown to reduce cholesterol, which can reduce the risk of heart disease. Follow these instructions at home: Eating and drinking If told by your health care provider:  Eat chicken (without skin), fish, veal, shellfish, ground [place] breast, and round or loin cuts of red meat.  Do not eat fried foods or fatty meats, such as hot dogs and salami.  Eat plenty of fruits, such as apples.  Eat plenty of vegetables, such as broccoli, potatoes, and carrots.  Eat beans, peas, and lentils.  Eat grains such as barley, rice, couscous, and bulgur wheat.  Eat pasta without cream sauces.  Use skim or nonfat milk, and eat low-fat or nonfat yogurt and cheeses.  Do not eat  or drink whole milk, cream, ice cream, egg yolks, or hard cheeses.  Do not eat stick margarine or tub margarines that contain trans fats (also called partially hydrogenated oils).  Do not eat saturated tropical oils, such as coconut oil and palm oil.  Do not eat cakes, cookies, crackers, or other baked goods that contain  trans fats.  General instructions  Exercise as directed by your health care provider. Increase your activity level with activities such as gardening, walking, and taking the stairs.  Take over-the-counter and prescription medicines only as told by your health care provider.  Do not use any products that contain nicotine or tobacco, such as cigarettes and e-cigarettes. If you need help quitting, ask your health care provider.  Keep all follow-up visits as told by your health care provider. This is important. Contact a health care provider if:  You are struggling to maintain a healthy diet or weight.  You need help to start on an exercise program.  You need help to stop smoking. Get help right away if:  You have chest pain.  You have trouble breathing. This information is not intended to replace advice given to you by your health care provider. Make sure you discuss any questions you have with your health care provider. Document Revised: 04/28/2017 Document Reviewed: 10/24/2015 Elsevier Patient Education  [place].  Cholesterol Content in Foods Cholesterol is a waxy, fat-like substance that helps to carry fat in the blood. The body needs cholesterol in small amounts, but too much cholesterol can cause damage to the arteries and heart. Most people should eat less than 200 milligrams (mg) of cholesterol a day. Foods with cholesterol  Cholesterol is found in animal-based foods, such as meat, seafood, and dairy. Generally, low-fat dairy and lean meats have less cholesterol than full-fat dairy and fatty meats. The milligrams of cholesterol per serving (mg per serving) of common cholesterol-containing foods are listed below. Meat and other proteins  Egg -- one large whole egg has 186 mg.  Veal shank -- 4 oz has 141 mg.  Lean ground [place] (93% lean) -- 4 oz has 118 mg.  Fat-trimmed lamb loin -- 4 oz has 106 mg.  Lean ground beef (90% lean) -- 4 oz has 100 mg.  Lobster  -- 3.5 oz has 90 mg.  Pork loin chops -- 4 oz has 86 mg.  Canned salmon -- 3.5 oz has 83 mg.  Fat-trimmed beef top loin -- 4 oz has 78 mg.  Frankfurter -- 1 frank (3.5 oz) has 77 mg.  Crab -- 3.5 oz has 71 mg.  Roasted chicken without skin, white meat -- 4 oz has 66 mg.  Light bologna -- 2 oz has 45 mg.  Deli-cut [place] -- 2 oz has 31 mg.  Canned tuna -- 3.5 oz has 31 mg.  Berniece Salines -- 1 oz has 29 mg.  Oysters and mussels (raw) -- 3.5 oz has 25 mg.  Mackerel -- 1 oz has 22 mg.  Trout -- 1 oz has 20 mg.  Pork sausage -- 1 link (1 oz) has 17 mg.  Salmon -- 1 oz has 16 mg.  Tilapia -- 1 oz has 14 mg. Dairy  Soft-serve ice cream --  cup (4 oz) has 103 mg.  Whole-milk yogurt -- 1 cup (8 oz) has 29 mg.  Cheddar cheese -- 1 oz has 28 mg.  American cheese -- 1 oz has 28 mg.  Whole milk -- 1 cup (8 oz) has 23 mg.  2% milk -- 1 cup (8 oz) has 18 mg.  Cream cheese -- 1 tablespoon (Tbsp) has 15 mg.  Cottage cheese --  cup (4 oz) has 14 mg.  Low-fat (1%) milk -- 1 cup (8 oz) has 10 mg.  Sour cream -- 1 Tbsp has 8.5 mg.  Low-fat yogurt -- 1 cup (8 oz) has 8 mg.  Nonfat Greek yogurt -- 1 cup (8 oz) has 7 mg.  Half-and-half cream -- 1 Tbsp has 5 mg. Fats and oils  Cod liver oil -- 1 tablespoon (Tbsp) has 82 mg.  Butter -- 1 Tbsp has 15 mg.  Lard -- 1 Tbsp has 14 mg.  Bacon grease -- 1 Tbsp has 14 mg.  Mayonnaise -- 1 Tbsp has 5-10 mg.  Margarine -- 1 Tbsp has 3-10 mg. Exact amounts of cholesterol in these foods may vary depending on specific ingredients and brands. Foods without cholesterol Most plant-based foods do not have cholesterol unless you combine them with a food that has cholesterol. Foods without cholesterol include:  Grains and cereals.  Vegetables.  Fruits.  Vegetable oils, such as olive, canola, and sunflower oil.  Legumes, such as peas, beans, and lentils.  Nuts and seeds.  Egg whites. Summary  The body needs cholesterol in  small amounts, but too much cholesterol can cause damage to the arteries and heart.  Most people should eat less than 200 milligrams (mg) of cholesterol a day. This information is not intended to replace advice given to you by your health care provider. Make sure you discuss any questions you have with your health care provider. Document Revised: 04/07/2017 Document Reviewed: 12/20/2016 Elsevier Patient Education  [place].

## 2020-03-27 NOTE — Progress Notes (Addendum)
Chief Complaint  Patient presents with  . Follow-up   Annual  1. 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 ED visit 12/23/19 [facility] palpitations thinks 2/2 to anxiety/stress due to sisters health issues resolved for now declines cards ekg ed nsr sx's were worse at night now resolved  3. Right ear wax impaciton  4. Left sided lymph node inflamed  5. Pain in low back 4/10 intermittent   Review of Systems  Constitutional: Negative for weight loss.  HENT: Negative for hearing loss.   Eyes: Negative for blurred vision.  Respiratory: Negative for shortness of breath.   Cardiovascular: Negative for chest pain.  Gastrointestinal: Negative for abdominal pain.  Musculoskeletal: Positive for back pain.  Skin: Negative for rash.  Neurological: Negative for headaches.  Psychiatric/Behavioral: Negative for depression.   Past Medical History:  Diagnosis Date  . Allergy   . Arthritis    hands   . Asthma   . Colon polyps   . Depression   . Diabetes mellitus without complication ([place])   . Diverticulitis   . Fatty liver   . History of chicken pox   . Hyperlipidemia   . Hypertension    Past Surgical History:  Procedure Laterality Date  . ABDOMINAL HYSTERECTOMY     DUB ovaries intact s/p bladder suspension/sling no h/o abnormal pap   . CATARACT EXTRACTION, BILATERAL     right 03/29/19 and 04/14/19 Left    Family History  Problem Relation Age of Onset  . Arthritis Mother   . Depression Mother   . Diabetes Mother   . Heart disease Mother        MI, CHF  . Hyperlipidemia Mother   . Hypertension Mother   . Diabetes Father   . Heart disease Father   . Stroke Father   . Cancer Sister        glioblastoma  . Depression Sister   . Diabetes Sister   . Hyperlipidemia Sister   .  Hypertension Sister   . Mental illness Sister   . Diabetes Son        type 1  . Heart disease Maternal Grandmother   . Heart disease Maternal Grandfather    Social History   Socioeconomic History  . Marital status: Divorced    Spouse name: Not on file  . Number of children: Not on file  . Years of education: Not on file  . Highest education level: Not on file  Occupational History  . Not on file  Tobacco Use  . Smoking status: Former Research scientist (life sciences)  . Smokeless tobacco: Never Used  . Tobacco comment: quit age 43 y.o duration 12-15 years 1 ppd   Substance and Sexual Activity  . Alcohol use: Yes  . Drug use: Not Currently  . Sexual activity: Not Currently  Other Topics Concern  . Not on file  Social History Narrative   Divorced    1 son    1 dog Sadie   Associates degree, Primary school teacher    Sister lives with her    No guns   Wears seat belt, safe in relationship    Former smoker quit age 22 smoked 12-15 years total 1ppd    Social Determinants of Health  Financial Resource Strain:   . Difficulty of Paying Living Expenses: Not on file  Food Insecurity:   . Worried About Charity fundraiser in the Last Year: Not on file  . Ran Out of Food in the Last Year: Not on file  Transportation Needs:   . Lack of Transportation (Medical): Not on file  . Lack of Transportation (Non-Medical): Not on file  Physical Activity:   . Days of Exercise per Week: Not on file  . Minutes of Exercise per Session: Not on file  Stress:   . Feeling of Stress : Not on file  Social Connections:   . Frequency of Communication with Friends and Family: Not on file  . Frequency of Social Gatherings with Friends and Family: Not on file  . Attends Religious Services: Not on file  . Active Member of Clubs or Organizations: Not on file  . Attends Archivist Meetings: Not on file  . Marital Status: Not on file  Intimate Partner Violence:   . Fear of Current or Ex-Partner: Not on  file  . Emotionally Abused: Not on file  . Physically Abused: Not on file  . Sexually Abused: Not on file   Current Meds  Medication Sig  . amLODipine (NORVASC) 10 MG tablet Take 1 tablet (10 mg total) by mouth daily.  Marland Kitchen atorvastatin (LIPITOR) 20 MG tablet TAKE 1 TABLET ONE TIME DAILY at night  . cycloSPORINE (RESTASIS) 0.05 % ophthalmic emulsion 1 drop 2 (two) times daily.  . diphenhydrAMINE-APAP, sleep, (TYLENOL PM EXTRA STRENGTH PO) Take by mouth.  . fluticasone furoate-vilanterol (BREO ELLIPTA) 200-25 MCG/INH AEPB Inhale 1 puff into the lungs daily. Rinse mouth with use  . hydrochlorothiazide (HYDRODIURIL) 25 MG tablet Take 1 tablet (25 mg total) by mouth daily. In am  . Lancets (FREESTYLE) lancets Bid Use as instructed e11.9  . loratadine (CLARITIN) 10 MG tablet Take 10 mg by mouth daily.  Marland Kitchen losartan (COZAAR) 100 MG tablet Take 1 tablet (100 mg total) by mouth daily.  . potassium chloride (KLOR-CON) 10 MEQ tablet Take 1 tablet (10 mEq total) by mouth daily.  . sertraline (ZOLOFT) 100 MG tablet Take 1 tablet (100 mg total) by mouth daily.  . sitaGLIPtin (JANUVIA) 100 MG tablet Take 1 tablet (100 mg total) by mouth daily.  . [DISCONTINUED] fluticasone furoate-vilanterol (BREO ELLIPTA) 200-25 MCG/INH AEPB Inhale 1 puff into the lungs daily. Rinse mouth with use  . [DISCONTINUED] sertraline (ZOLOFT) 100 MG tablet Take 50 mg by mouth daily.    Allergies  Allergen Reactions  . Amoxicillin-Pot Clavulanate Diarrhea  . Ciprofloxacin Other (See Comments)    Made her more sick  . Metformin Diarrhea   Recent Results (from the past 2160 hour(s))  Comprehensive metabolic panel     Status: Abnormal   Collection Time: 03/24/20  8:03 AM  Result Value Ref Range   Glucose 124 (H) 65 - 99 mg/dL   BUN 13 8 - 27 mg/dL   Creatinine, Ser 0.66 0.57 - 1.00 mg/dL   GFR calc non Af Amer 93 >59 mL/min/1.73   GFR calc Af Amer 107 >59 mL/min/1.73    Comment: **In accordance with recommendations from the  NKF-ASN Task force,**   [facility] is in the process of updating its eGFR calculation to the   2021 CKD-EPI creatinine equation that estimates kidney function   without a race variable.    BUN/Creatinine Ratio 20 12 - 28   Sodium 140 134 - 144 mmol/L  Potassium 4.2 3.5 - 5.2 mmol/L   Chloride 100 96 - 106 mmol/L   CO2 23 20 - 29 mmol/L   Calcium 9.7 8.7 - 10.3 mg/dL   Total Protein 7.2 6.0 - 8.5 g/dL   Albumin 4.6 3.8 - 4.8 g/dL   Globulin, Total 2.6 1.5 - 4.5 g/dL   Albumin/Globulin Ratio 1.8 1.2 - 2.2   Bilirubin Total 0.9 0.0 - 1.2 mg/dL   Alkaline Phosphatase 78 44 - 121 IU/L    Comment:               **Please note reference interval change**   AST 20 0 - 40 IU/L   ALT 21 0 - 32 IU/L  Lipid panel     Status: Abnormal   Collection Time: 03/24/20  8:03 AM  Result Value Ref Range   Cholesterol, Total 214 (H) 100 - 199 mg/dL   Triglycerides 132 0 - 149 mg/dL   HDL 77 >39 mg/dL   VLDL Cholesterol Cal 23 5 - 40 mg/dL   LDL Chol Calc (NIH) 114 (H) 0 - 99 mg/dL   Chol/HDL Ratio 2.8 0.0 - 4.4 ratio    Comment:                                   T. Chol/HDL Ratio                                             Men  Women                               1/2 Avg.Risk  3.4    3.3                                   Avg.Risk  5.0    4.4                                2X Avg.Risk  9.6    7.1                                3X Avg.Risk 23.4   11.0   CBC w/Diff     Status: None   Collection Time: 03/24/20  8:03 AM  Result Value Ref Range   WBC 6.7 3.4 - 10.8 x10E3/uL   RBC 4.64 3.77 - 5.28 x10E6/uL   Hemoglobin 15.0 11.1 - 15.9 g/dL   Hematocrit 44.6 34.0 - 46.6 %   MCV 96 79 - 97 fL   MCH 32.3 26.6 - 33.0 pg   MCHC 33.6 31 - 35 g/dL   RDW 11.9 11.7 - 15.4 %   Platelets 298 150 - 450 x10E3/uL   Neutrophils 66 Not Estab. %   Lymphs 27 Not Estab. %   Monocytes 6 Not Estab. %   Eos 1 Not Estab. %   Basos 0 Not Estab. %   Neutrophils Absolute 4.4 1.40 - 7.00 x10E3/uL   Lymphocytes Absolute  1.8 0 - 3 x10E3/uL   Monocytes Absolute 0.4 0 - 0 x10E3/uL  EOS (ABSOLUTE) 0.1 0.0 - 0.4 x10E3/uL   Basophils Absolute 0.0 0 - 0 x10E3/uL   Immature Granulocytes 0 Not Estab. %   Immature Grans (Abs) 0.0 0.0 - 0.1 x10E3/uL  Urinalysis, Routine w reflex microscopic     Status: Abnormal   Collection Time: 03/24/20  8:03 AM  Result Value Ref Range   Specific Gravity, UA 1.016 1.005 - 1.030   pH, UA 6.5 5.0 - 7.5   Color, UA Yellow Yellow   Appearance Ur Clear Clear   Leukocytes,UA 1+ (A) Negative   Protein,UA Negative Negative/Trace   Glucose, UA Negative Negative   Ketones, UA Negative Negative   RBC, UA Negative Negative   Bilirubin, UA Negative Negative   Urobilinogen, Ur 0.2 0.2 - 1.0 mg/dL   Nitrite, UA Negative Negative   Microscopic Examination See below:     Comment: Microscopic was indicated and was performed.  TSH     Status: None   Collection Time: 03/24/20  8:03 AM  Result Value Ref Range   TSH 2.270 0.450 - 4.500 uIU/mL  Hemoglobin A1c     Status: Abnormal   Collection Time: 03/24/20  8:03 AM  Result Value Ref Range   Hgb A1c MFr Bld 5.9 (H) 4.8 - 5.6 %    Comment:          Prediabetes: 5.7 - 6.4          Diabetes: >6.4          Glycemic control for adults with diabetes: <7.0    Est. average glucose Bld gHb Est-mCnc 123 mg/dL  Microalbumin / creatinine urine ratio     Status: None   Collection Time: 03/24/20  8:03 AM  Result Value Ref Range   Creatinine, Urine 32.6 Not Estab. mg/dL   Microalbumin, Urine <3.0 Not Estab. ug/mL   Microalb/Creat Ratio <9 0 - 29 mg/g creat    Comment:                        Normal:                0 -  29                        Moderately increased: 30 - 300                        Severely increased:       >300   Microscopic Examination     Status: None   Collection Time: 03/24/20  8:03 AM  Result Value Ref Range   WBC, UA 0-5 0 - 5 /hpf   RBC None seen 0 - 2 /hpf   Epithelial Cells (non renal) 0-10 0 - 10 /hpf   Casts None  seen None seen /lpf   Bacteria, UA None seen None seen/Few   Objective  Body mass index is 28.9 kg/m. Wt Readings from Last 3 Encounters:  03/27/20 158 lb (71.7 kg)  09/25/19 156 lb (70.8 kg)  05/21/19 167 lb (75.8 kg)   Temp Readings from Last 3 Encounters:  03/27/20 97.6 F (36.4 C) (Oral)  12/23/19 98.9 F (37.2 C)  09/25/19 (!) 97 F (36.1 C) (Temporal)   BP Readings from Last 3 Encounters:  03/27/20 130/88  12/23/19 121/83  09/25/19 126/78   Pulse Readings from Last 3 Encounters:  03/27/20 76  12/23/19 69  09/25/19 83  Physical Exam Vitals and nursing note reviewed.  Constitutional:      Appearance: Normal appearance. She is well-developed and well-groomed. She is obese.  HENT:     Head: Normocephalic and atraumatic.  Eyes:     Conjunctiva/sclera: Conjunctivae normal.     Pupils: Pupils are equal, round, and reactive to light.  Cardiovascular:     Rate and Rhythm: Normal rate and regular rhythm.     Heart sounds: Normal heart sounds. No murmur heard.   Pulmonary:     Effort: Pulmonary effort is normal.     Breath sounds: Normal breath sounds.  Skin:    General: Skin is warm and dry.  Neurological:     General: No focal deficit present.     Mental Status: She is alert and oriented to person, place, and time. Mental status is at baseline.     Coordination: Romberg sign negative.  Psychiatric:        Attention and Perception: Attention and perception normal.        Mood and Affect: Mood and affect normal.        Speech: Speech normal.        Behavior: Behavior normal. Behavior is cooperative.        Thought Content: Thought content normal.        Cognition and Memory: Cognition and memory normal.        Judgment: Judgment normal.     Assessment  Plan  Hypertension associated with diabetes ([place]) controlled- Plan: Comprehensive metabolic panel, Comprehensive metabolic panel, Lipid panel, CBC with Differential/Platelet, Hemoglobin A1c, TSH,  anuvia  100 mg qd and htn controlled norvasc 10 mg qd hctz 25 mg qd and losartan 100 mg Fasting labs in 6 months and f/u in 6 months Consider change norvasc 10 due to dental hyperplasia  Saw eye MD last week   Stress Palpitations Anxiety and depression - Plan: sertraline (ZOLOFT) 100 MG tablet Declines cards and BB for now   Mild asthma without complication, unspecified whether persistent - Plan: fluticasone furoate-vilanterol (BREO ELLIPTA) 200-25 MCG/INH AEPB  Bilateral dry eyes Fu eye MD restasis  Impacted cerumen of right ear with pain Consented and tolerated flush and currette right ear but not all wax removed  Call back 11/29 if further flush needed rec use debrox drops otc  Lymphadenopathy, cervical - Plan: US Soft Tissue Head/Neck (NON-THYROID) Referred [facility] endocrine thyroid bx IMPRESSION: 1. Nodule #2, correlating with the nodule seen on preceding neck CT, meets imaging criteria to recommend percutaneous sampling as indicated. 2. Nodule #1 meets imaging criteria to recommend a 1 year follow-up.  The above is in keeping with the ACR TI-RADS recommendations - J Am Coll Radiol 2017;14:587-595.   Electronically Signed   By: Sandi Mariscal M.D.   On: 05/14/2020 15:35 Chronic low back pain, unspecified back pain laterality, unspecified whether sciatica present  Prn tylenol if needed   HM Hep A/B 04/2020, Prevnar 05/2020, 06/2020 Tdap Flu shotutd -utd pna 23  -will get prevnar, Tdap  -rec twinrix had 2/3 doses with h/o fatty liver  had shingrix2/2 moderna 2/2 sch 03/27/20 3rd dose Declines MMR vx   S/p hysterectomy DUB no h/o abnormal pap no need further pap mammo neg9/30/20ordered1/7/22 Colonoscopy 07/07/16 tubular adenoma neg path repeat in 5 years  DEXA 07/10/19 normal  Hep C neg 02/24/15 Skin no current issues reassess each visit  Pulm [facility] Delma Freeze [facility] [place]  [facility] dentist  Former PCP Dr.  Arbutus Ped MD 12/21/17 last seen    [facility] mail order refill  [facility]  Provider: Dr. Olivia Mackie McLean-Scocuzza-Internal Medicine

## 2020-03-27 NOTE — Progress Notes (Signed)
Patient presented for ear irrigation due to cerumen impaction. Patient was informed of the possible side effects of having their ear flushed; light headedness, dizziness, nausea, vomiting and rupture ear drum. Items sed or that can be used are the elephant pump, catch basin, ear curettes, hydrogen peroxide (half a bottle for ear irrigation solution), and a stool softener (1-2CC for softening ear wax). Patient has given a verbal consent to have ear irrigation. patient voiced no concerns nor showed any signs of distress during procedure. Ear canal has become visibly clear  

## 2020-04-06 ENCOUNTER — Ambulatory Visit
Admission: RE | Admit: 2020-04-06 | Discharge: 2020-04-06 | Disposition: A | Discharge status: Home / Self Care | Payer: 59 | Source: Ambulatory Visit | Attending: Internal Medicine | Admitting: Internal Medicine

## 2020-04-06 ENCOUNTER — Other Ambulatory Visit: Payer: Self-pay

## 2020-04-06 DIAGNOSIS — R590 Localized enlarged lymph nodes: Secondary | ICD-10-CM

## 2020-04-09 ENCOUNTER — Encounter: Payer: Self-pay | Admitting: Internal Medicine

## 2020-04-09 LAB — HM DIABETES EYE EXAM

## 2020-04-13 DIAGNOSIS — R590 Localized enlarged lymph nodes: Secondary | ICD-10-CM | POA: Insufficient documentation

## 2020-04-13 NOTE — Addendum Note (Signed)
Addended by: Orland Mustard on: 04/13/2020 09:59 AM   Modules accepted: Orders

## 2020-04-14 ENCOUNTER — Telehealth: Payer: Self-pay

## 2020-04-14 NOTE — Telephone Encounter (Signed)
Good morning!  Thank you! Auth was submitted to ins.

## 2020-04-14 NOTE — Telephone Encounter (Signed)
Pt called and wants to know what is going on with her referral to get a CT. She states that she has not heard anything about it and she is concerned.

## 2020-04-27 ENCOUNTER — Other Ambulatory Visit: Payer: Self-pay

## 2020-04-27 ENCOUNTER — Ambulatory Visit
Admission: RE | Admit: 2020-04-27 | Discharge: 2020-04-27 | Disposition: A | Discharge status: Home / Self Care | Payer: 59 | Source: Ambulatory Visit | Attending: Internal Medicine | Admitting: Internal Medicine

## 2020-04-27 DIAGNOSIS — R590 Localized enlarged lymph nodes: Secondary | ICD-10-CM | POA: Diagnosis not present

## 2020-04-27 LAB — POCT I-STAT CREATININE: Creatinine, Ser: 0.6 mg/dL (ref 0.44–1.00)

## 2020-04-27 MED ORDER — IOHEXOL 300 MG/ML  SOLN
75.0000 mL | Freq: Once | INTRAMUSCULAR | Status: AC | PRN
  Administered 2020-04-27: 75 mL via INTRAVENOUS

## 2020-04-28 ENCOUNTER — Telehealth: Payer: Self-pay

## 2020-04-28 ENCOUNTER — Encounter: Payer: Self-pay | Admitting: Internal Medicine

## 2020-04-28 DIAGNOSIS — E041 Nontoxic single thyroid nodule: Secondary | ICD-10-CM | POA: Insufficient documentation

## 2020-04-28 NOTE — Addendum Note (Signed)
Addended by: Orland Mustard on: 04/28/2020 06:07 PM   Modules accepted: Orders

## 2020-04-28 NOTE — Telephone Encounter (Signed)
Pt called back. °

## 2020-04-28 NOTE — Telephone Encounter (Signed)
Returned the phone call and left another message to call back.

## 2020-04-28 NOTE — Telephone Encounter (Signed)
Called and left a message to call back for lab results.

## 2020-04-28 NOTE — Telephone Encounter (Signed)
Patient returned office phone call for results 

## 2020-05-05 ENCOUNTER — Telehealth: Payer: Self-pay | Admitting: Internal Medicine

## 2020-05-05 NOTE — Telephone Encounter (Signed)
lft vm for pt to call [phone] option 3 and then 2 to sch US Thyroid

## 2020-05-14 ENCOUNTER — Ambulatory Visit
Admission: RE | Admit: 2020-05-14 | Discharge: 2020-05-14 | Disposition: A | Discharge status: Home / Self Care | Payer: 59 | Source: Ambulatory Visit | Attending: Internal Medicine | Admitting: Internal Medicine

## 2020-05-14 ENCOUNTER — Other Ambulatory Visit: Payer: Self-pay

## 2020-05-14 DIAGNOSIS — E041 Nontoxic single thyroid nodule: Secondary | ICD-10-CM | POA: Insufficient documentation

## 2020-05-15 NOTE — Addendum Note (Signed)
Addended by: Orland Mustard on: 05/15/2020 05:39 PM   Modules accepted: Orders

## 2020-06-04 ENCOUNTER — Ambulatory Visit
Admission: RE | Admit: 2020-06-04 | Discharge: 2020-06-04 | Disposition: A | Discharge status: Home / Self Care | Payer: 59 | Source: Ambulatory Visit | Attending: Internal Medicine | Admitting: Internal Medicine

## 2020-06-04 ENCOUNTER — Other Ambulatory Visit: Payer: Self-pay

## 2020-06-04 DIAGNOSIS — Z1231 Encounter for screening mammogram for malignant neoplasm of breast: Secondary | ICD-10-CM

## 2020-07-23 ENCOUNTER — Other Ambulatory Visit: Payer: Self-pay | Admitting: Internal Medicine

## 2020-07-23 DIAGNOSIS — F32A Depression, unspecified: Secondary | ICD-10-CM

## 2020-07-23 DIAGNOSIS — F419 Anxiety disorder, unspecified: Secondary | ICD-10-CM

## 2020-07-23 MED ORDER — SERTRALINE HCL 100 MG PO TABS: 100.0000 mg | ORAL_TABLET | Freq: Every day | ORAL | 3 refills | Status: DC

## 2020-07-28 IMAGING — CR LUMBAR SPINE - COMPLETE 4+ VIEW
1 series · 6 of 6 positions shown · non-contrast
Comparison: None.

CLINICAL DATA: Intermittent back pain for the past 6-7 years. No
known injury.

EXAM:
LUMBAR SPINE - COMPLETE 4+ VIEW

[Series 1: dg lumbar spine complete 4 +v · 0.14mm/px · 6 of 6 slices shown]
[im 1/6]
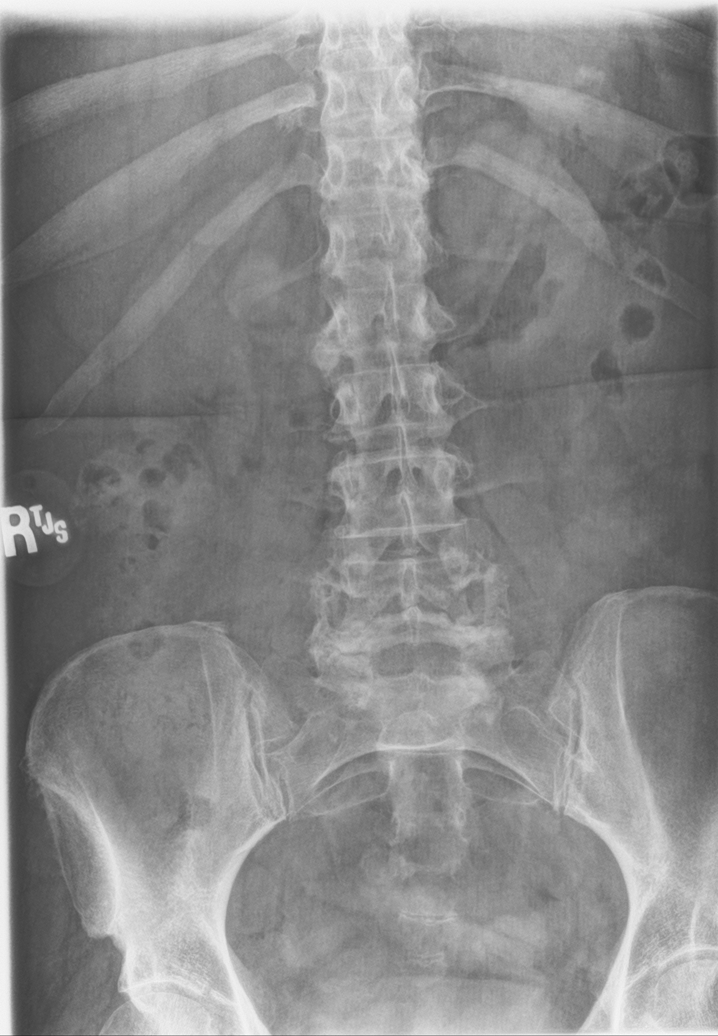
[im 2/6]
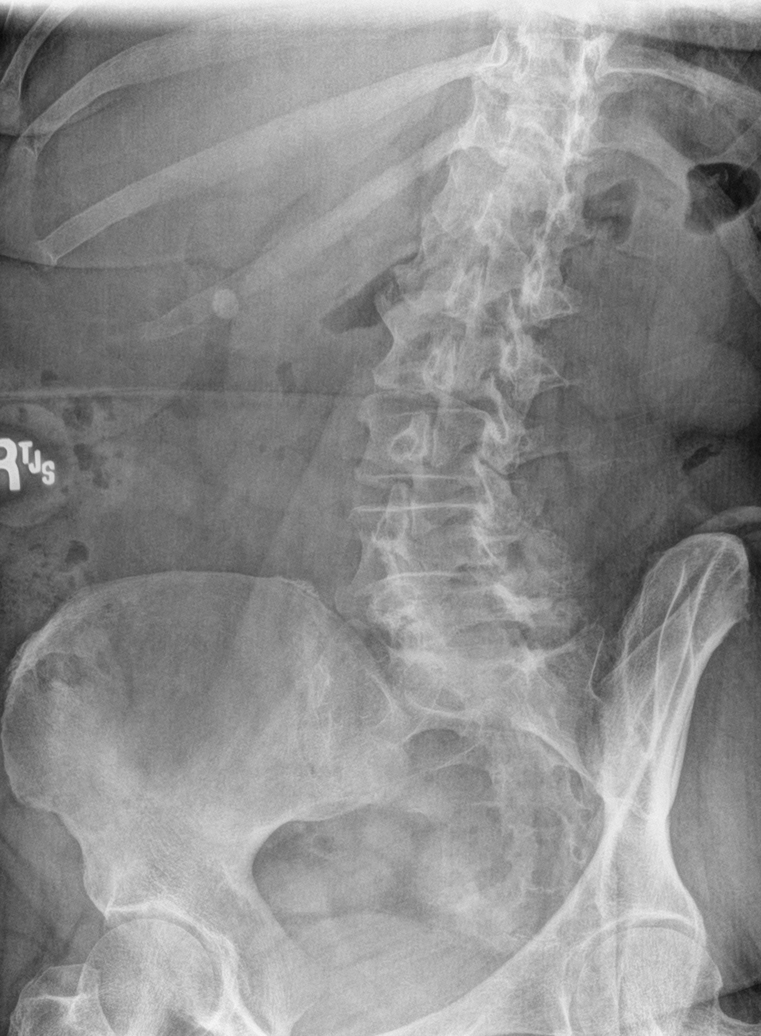
[im 3/6]
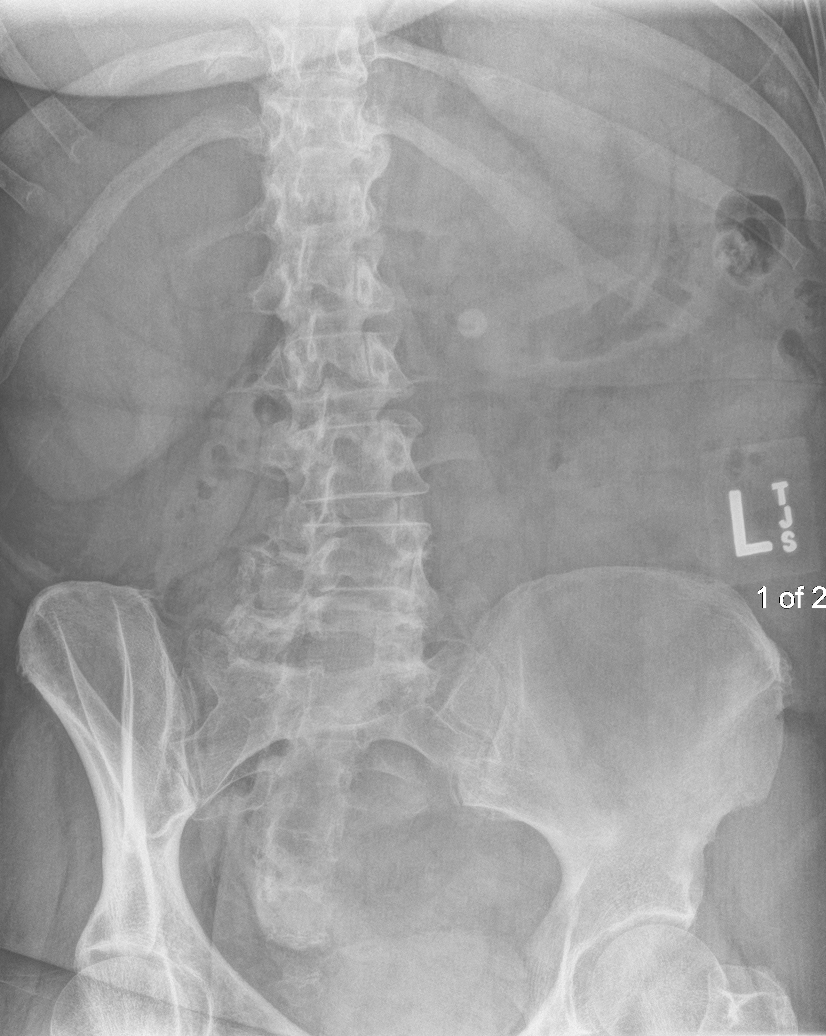
[im 4/6]
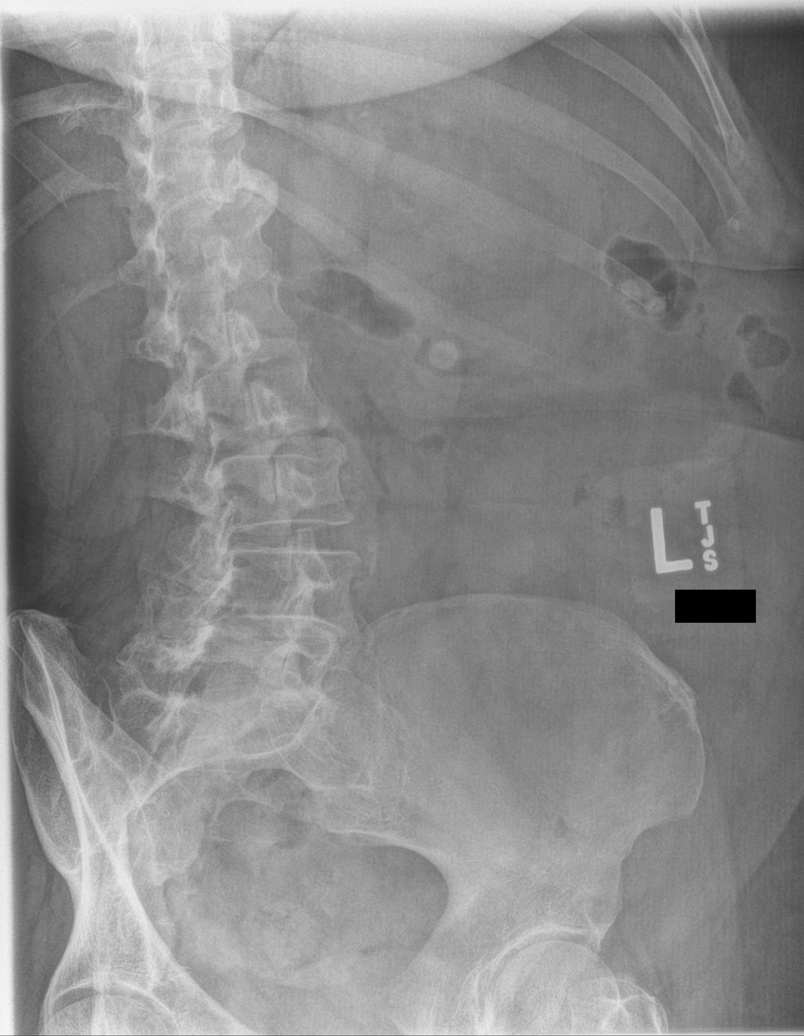
[im 5/6]
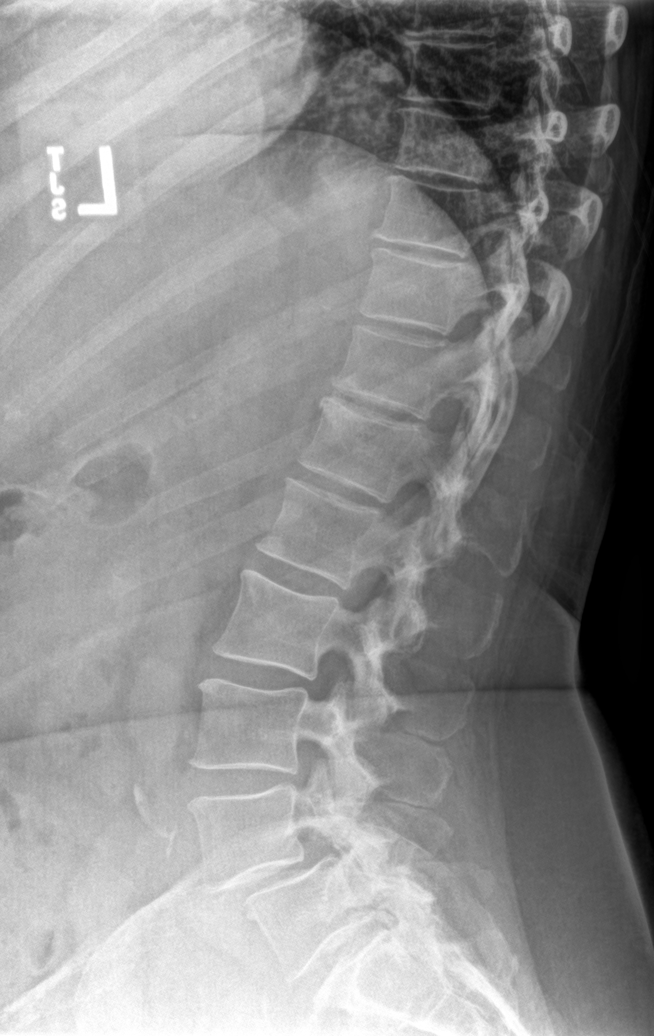
[im 6/6]
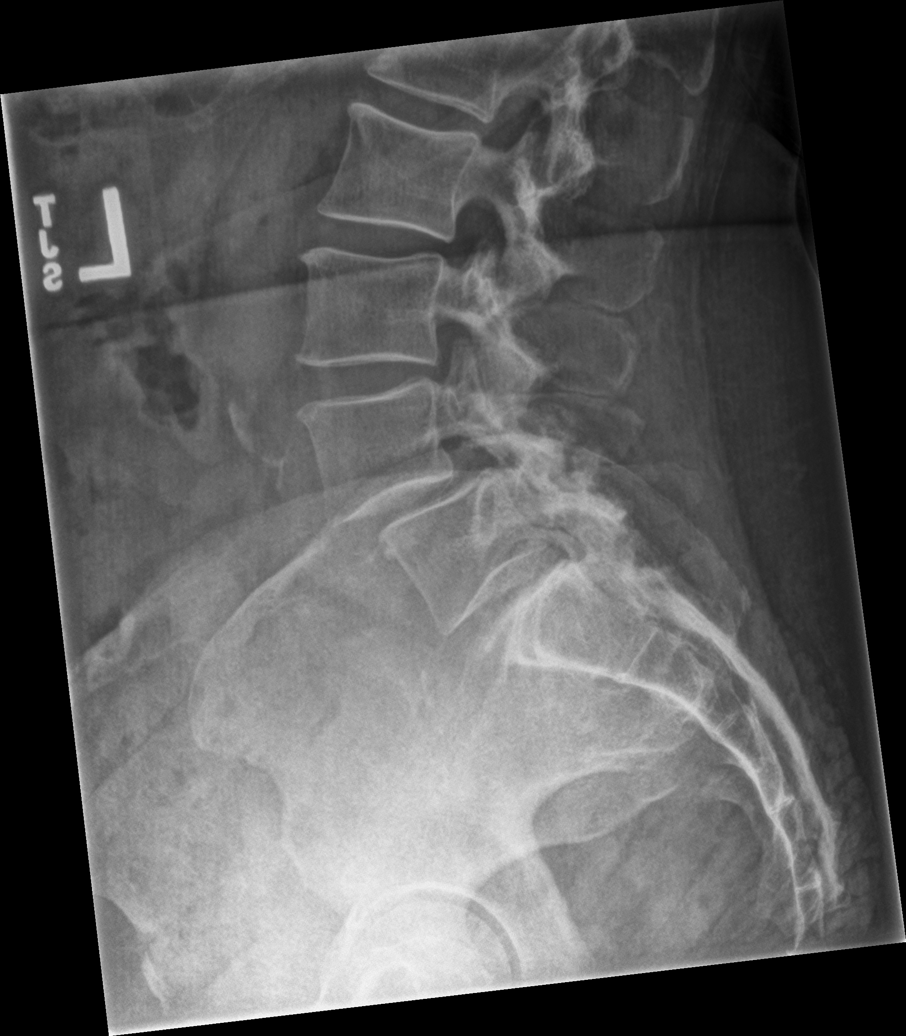

[6 of 6 positions shown; findings below may reference images not displayed]

FINDINGS: There are 5 non rib-bearing lumbar type vertebral bodies with
diminutive ribs seen bilaterally at T12.

Grade 1 anterolisthesis of L4 upon L5 measuring approximately 1.1 cm
in diameter with suspected bilateral L4 pars defects. No
retrolisthesis.

Lumbar vertebral body heights appear preserved.

Mild to moderate multilevel lumbar spine DDD, worse at L4-L5 with
disc space height loss, endplate irregularity and sclerosis.

Moderate to severe bilateral facet degenerative change of the lower
lumbar spine.

Limited visualization of the bilateral SI joints is normal.

A radiopaque button overlies the mid abdomen. Atherosclerotic plaque
within the abdominal aorta.
IMPRESSION: 1. Mild-to-moderate multilevel lumbar spine DDD.
2. Grade 1 anterolisthesis of L4 upon L5 with suspected bilateral L4
pars defects.
3. Moderate to severe bilateral facet degenerative change of the
lower lumbar spine.
4.  Aortic Atherosclerosis ([id number]).

## 2020-07-28 IMAGING — CR DG HIP (WITH OR WITHOUT PELVIS) 2-3V RIGHT
1 series · 4 of 4 positions shown · non-contrast
Comparison: None.

CLINICAL DATA: Right hip bursitis for the past 10 years. No prior
injury or surgery.

EXAM:
DG HIP (WITH OR WITHOUT PELVIS) 2-3V RIGHT

[Series 1: dg hip unilat w or w/o pelvis 2-3 views  · non-contrast · 0.14mm/px · 4 of 4 slices shown]
[im 1/4]
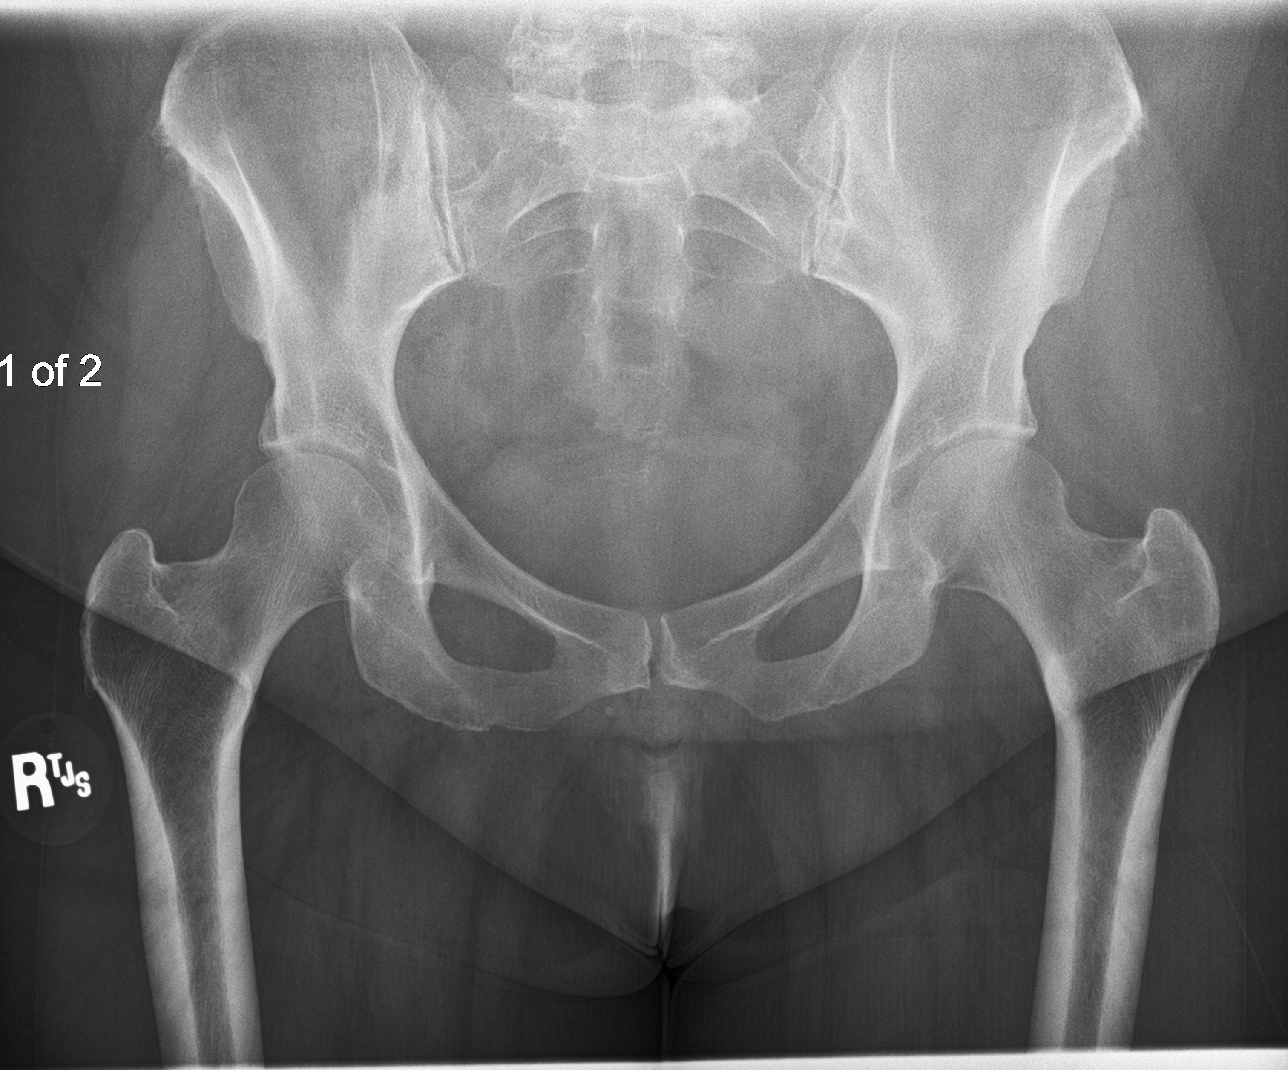
[im 2/4]
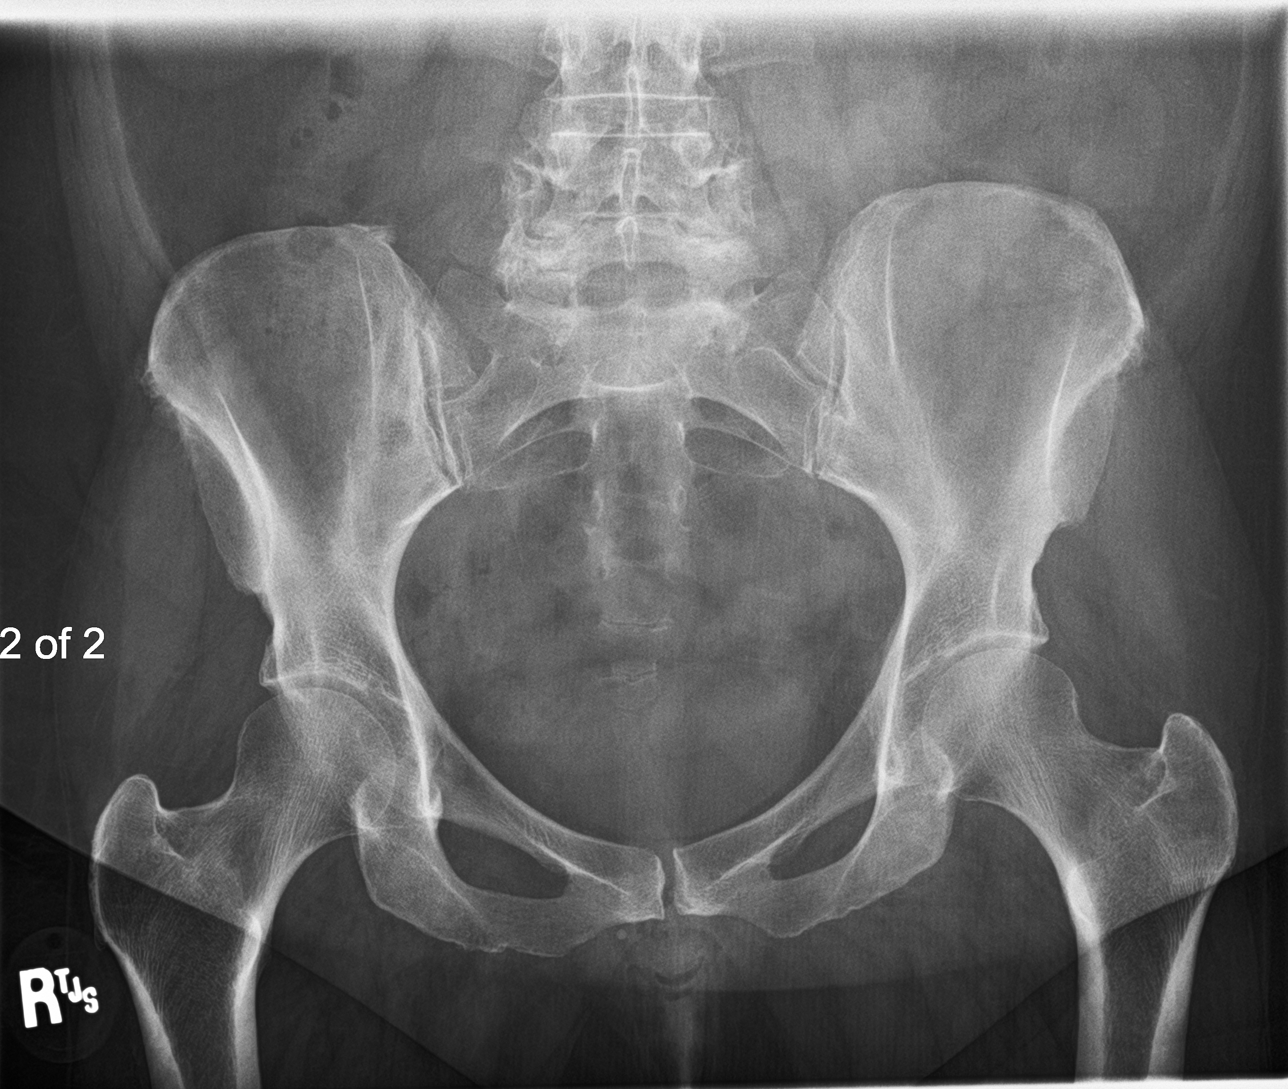
[im 3/4]
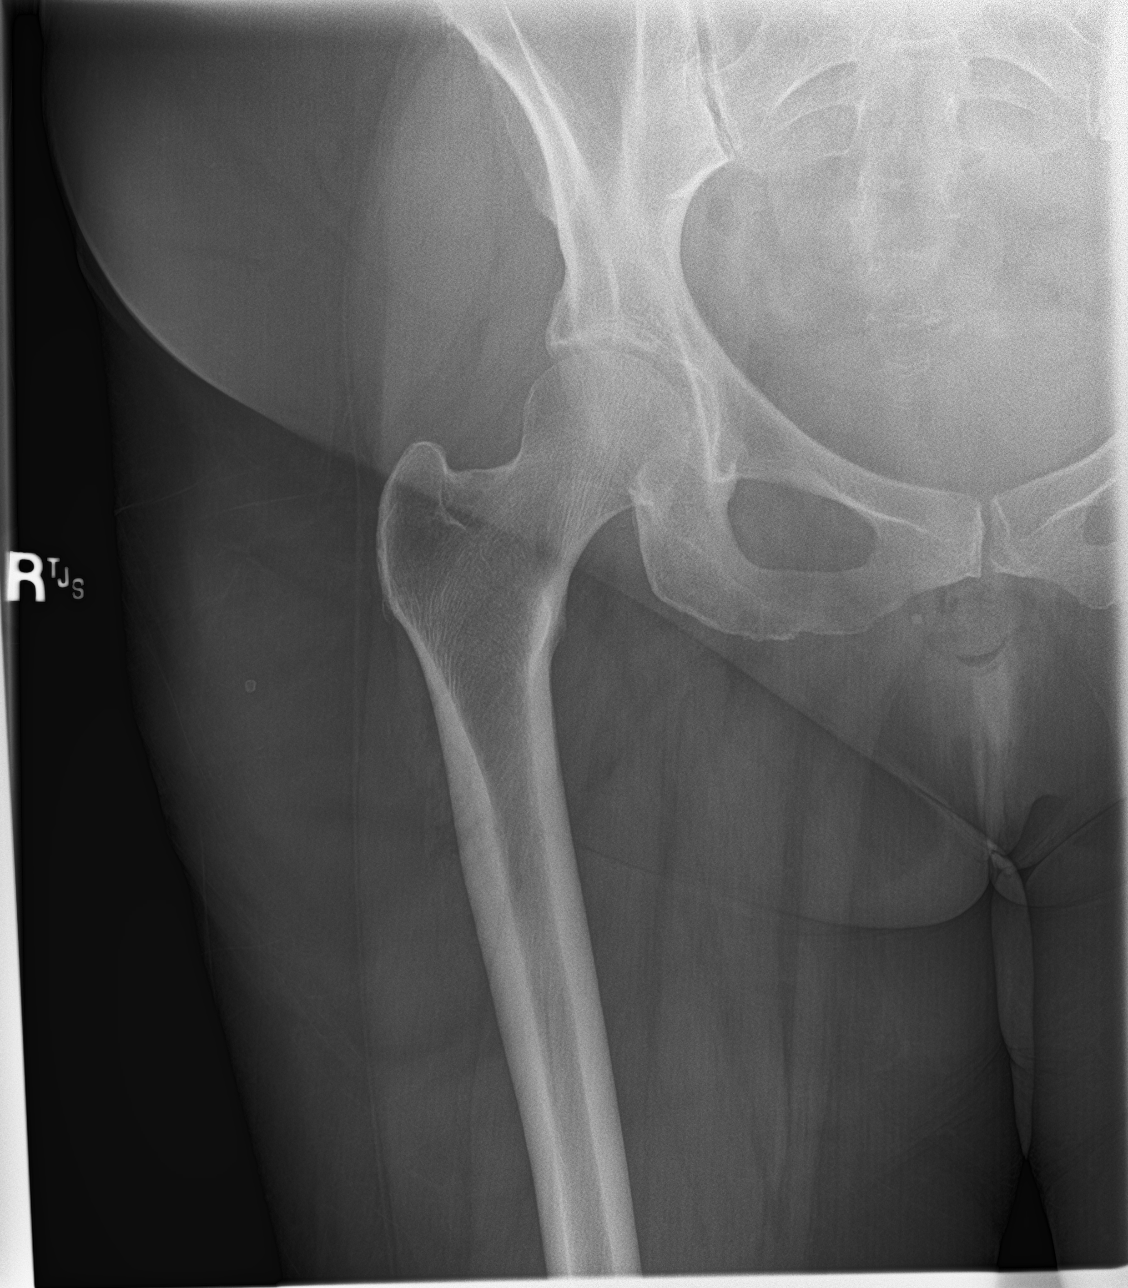
[im 4/4]
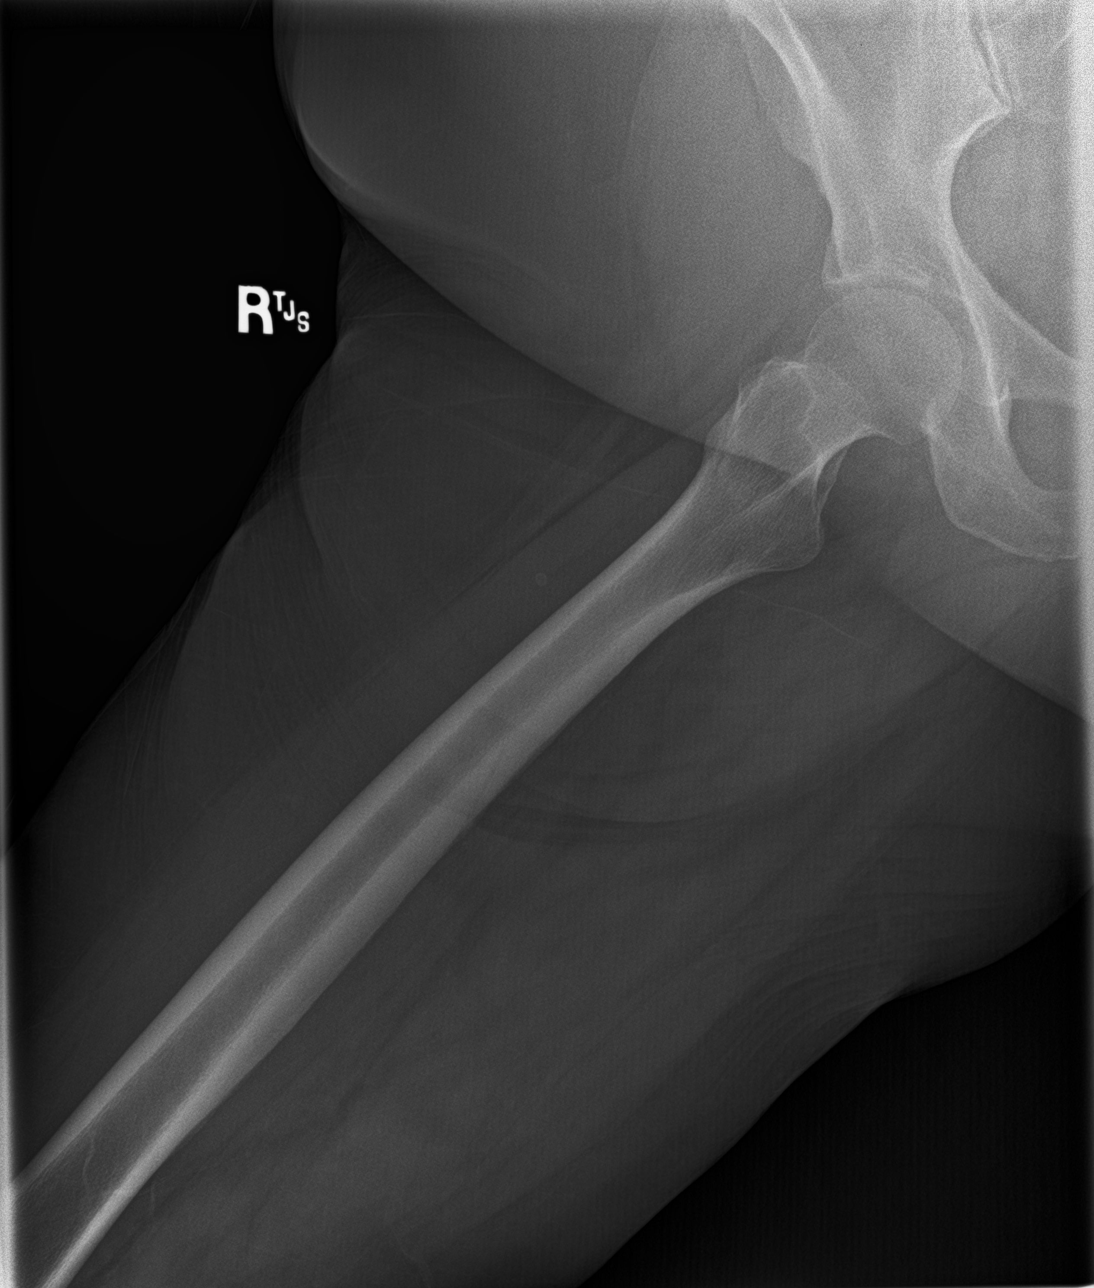

[4 of 4 positions shown; findings below may reference images not displayed]

FINDINGS: No fracture or dislocation. Right hip joint spaces appear preserved.
No evidence of avascular necrosis.

Limited visualization of the pelvis and contralateral left hip is
normal. Degenerative change the lower lumbar spine is suspected
though incompletely evaluated.

Regional soft tissues appear normal.
IMPRESSION: No explanation for patient's persistent right hip pain.

## 2020-09-16 ENCOUNTER — Ambulatory Visit: Payer: 59 | Admitting: Internal Medicine

## 2020-10-08 ENCOUNTER — Ambulatory Visit: Payer: 59 | Admitting: Internal Medicine

## 2020-10-29 ENCOUNTER — Other Ambulatory Visit: Payer: Self-pay

## 2020-10-29 ENCOUNTER — Encounter: Payer: Self-pay | Admitting: Emergency Medicine

## 2020-10-29 ENCOUNTER — Encounter: Payer: Self-pay | Admitting: Family Medicine

## 2020-10-29 ENCOUNTER — Emergency Department: Payer: 59

## 2020-10-29 ENCOUNTER — Emergency Department
Admission: EM | Admit: 2020-10-29 | Discharge: 2020-10-29 | Disposition: A | Discharge status: Home / Self Care | Payer: 59 | Attending: Emergency Medicine | Admitting: Emergency Medicine

## 2020-10-29 ENCOUNTER — Telehealth (INDEPENDENT_AMBULATORY_CARE_PROVIDER_SITE_OTHER): Payer: 59 | Admitting: Family Medicine

## 2020-10-29 VITALS — Ht 62.0 in | Wt 180.0 lb

## 2020-10-29 DIAGNOSIS — Z7951 Long term (current) use of inhaled steroids: Secondary | ICD-10-CM | POA: Diagnosis not present

## 2020-10-29 DIAGNOSIS — Z87891 Personal history of nicotine dependence: Secondary | ICD-10-CM | POA: Diagnosis not present

## 2020-10-29 DIAGNOSIS — R1030 Lower abdominal pain, unspecified: Secondary | ICD-10-CM

## 2020-10-29 DIAGNOSIS — Z79899 Other long term (current) drug therapy: Secondary | ICD-10-CM | POA: Diagnosis not present

## 2020-10-29 DIAGNOSIS — Z7984 Long term (current) use of oral hypoglycemic drugs: Secondary | ICD-10-CM | POA: Diagnosis not present

## 2020-10-29 DIAGNOSIS — K5792 Diverticulitis of intestine, part unspecified, without perforation or abscess without bleeding: Secondary | ICD-10-CM

## 2020-10-29 DIAGNOSIS — J45909 Unspecified asthma, uncomplicated: Secondary | ICD-10-CM | POA: Diagnosis not present

## 2020-10-29 DIAGNOSIS — E1169 Type 2 diabetes mellitus with other specified complication: Secondary | ICD-10-CM | POA: Insufficient documentation

## 2020-10-29 LAB — CBC
HCT: 38.8 % (ref 36.0–46.0)
Hemoglobin: 13.7 g/dL (ref 12.0–15.0)
MCH: 32.7 pg (ref 26.0–34.0)
MCHC: 35.3 g/dL (ref 30.0–36.0)
MCV: 92.6 fL (ref 80.0–100.0)
Platelets: 284 10*3/uL (ref 150–400)
RBC: 4.19 MIL/uL (ref 3.87–5.11)
RDW: 11.9 % (ref 11.5–15.5)
WBC: 7.9 10*3/uL (ref 4.0–10.5)
nRBC: 0 % (ref 0.0–0.2)

## 2020-10-29 LAB — COMPREHENSIVE METABOLIC PANEL
ALT: 24 U/L (ref 0–44)
AST: 21 U/L (ref 15–41)
Albumin: 4.3 g/dL (ref 3.5–5.0)
Alkaline Phosphatase: 61 U/L (ref 38–126)
Anion gap: 8 (ref 5–15)
BUN: 15 mg/dL (ref 8–23)
CO2: 26 mmol/L (ref 22–32)
Calcium: 9.7 mg/dL (ref 8.9–10.3)
Chloride: 103 mmol/L (ref 98–111)
Creatinine, Ser: 0.6 mg/dL (ref 0.44–1.00)
GFR, Estimated: >60 mL/min (ref >60)
Glucose, Bld: 161 mg/dL — ABNORMAL HIGH (ref 70–99)
Potassium: 3.8 mmol/L (ref 3.5–5.1)
Sodium: 137 mmol/L (ref 135–145)
Total Bilirubin: 1.1 mg/dL (ref 0.3–1.2)
Total Protein: 7.7 g/dL (ref 6.5–8.1)

## 2020-10-29 LAB — URINALYSIS, COMPLETE (UACMP) WITH MICROSCOPIC
Bacteria, UA: NONE SEEN
Bilirubin Urine: NEGATIVE
Glucose, UA: NEGATIVE mg/dL
Ketones, ur: NEGATIVE mg/dL
Nitrite: NEGATIVE
Protein, ur: NEGATIVE mg/dL
Specific Gravity, Urine: 1.002 — ABNORMAL LOW (ref 1.005–1.030)
pH: 6 (ref 5.0–8.0)

## 2020-10-29 LAB — LIPASE, BLOOD: Lipase: 32 U/L (ref 11–51)

## 2020-10-29 MED ORDER — CEPHALEXIN 500 MG PO CAPS: 500.0000 mg | ORAL_CAPSULE | Freq: Three times a day (TID) | ORAL | 0 refills | Status: DC

## 2020-10-29 MED ORDER — CEPHALEXIN 500 MG PO CAPS: 500.0000 mg | ORAL_CAPSULE | Freq: Three times a day (TID) | ORAL | 0 refills | Status: AC

## 2020-10-29 MED ORDER — IOHEXOL 300 MG/ML  SOLN
100.0000 mL | Freq: Once | INTRAMUSCULAR | Status: AC | PRN
  Administered 2020-10-29: 100 mL via INTRAVENOUS

## 2020-10-29 MED ORDER — METRONIDAZOLE 500 MG PO TABS: 500.0000 mg | ORAL_TABLET | Freq: Three times a day (TID) | ORAL | 0 refills | Status: AC

## 2020-10-29 MED ORDER — METRONIDAZOLE 500 MG PO TABS: 500.0000 mg | ORAL_TABLET | Freq: Three times a day (TID) | ORAL | 0 refills | Status: DC

## 2020-10-29 NOTE — Discharge Instructions (Addendum)
Please follow up with the gastroenterologist.  Return to the ER for symptoms that change, worsen, or for new complaints.

## 2020-10-29 NOTE — ED Provider Notes (Signed)
-----------------------------------------   5:19 PM on 10/29/2020 ----------------------------------------- I have personally seen and evaluated the patient.  Patient states she is feeling somewhat better.  Left lower quadrant abdominal pain history of diverticulitis in the past.  Lab work is reassuring including a normal white blood cell count, afebrile and reassuring vitals.  Patient CT scan appears show uncomplicated diverticulitis.  Patient states significant diarrhea with Augmentin use and a Cipro allergy.  We will prescribe Keflex and Flagyl.  Patient is to follow-up with her doctor.  I discussed my typical diverticulitis return precautions.   Harvest Dark, MD 10/29/20 [phone]

## 2020-10-29 NOTE — ED Triage Notes (Signed)
Pt comes into the ED via POV c/o lower abdominal pain that started yesterday.  Pt has h/o diverticulitis.  Pt in NAD at this time with even and unlabored respirations.

## 2020-10-29 NOTE — Progress Notes (Signed)
Established Patient Office Visit  Subjective:  Patient ID: Jessica Snyder, female    DOB: April 29, 1956  Age: 65 y.o. MRN: [id number]  CC:  Chief Complaint  Patient presents with   Diverticulitis    Concerns about diverticulitis, pressure in lower abdominal pains symptoms x 2 days. Patient took left over antibiotics that she had.     HPI Jessica Snyder presents for 1 day history lower abdominal pain. History of diverticulitis 2 years ago. Pain is radiating into back. Paint with walking. Pain with sitting and standing. Felt feverish yesterday. Subjective. Took left over antibiotics because of the pain. Did stool. Stool was lighter. No blood or pus. No abdominal surgery. Vaginal hysterectomy in 2006. Patient says that stress triggers? PMH of htn, hyperlipidemia and controlled dm.  Past Medical History:  Diagnosis Date   Allergy    Arthritis    hands    Asthma    Colon polyps    Depression    Diabetes mellitus without complication ([place])    Diverticulitis    Fatty liver    History of chicken pox    Hyperlipidemia    Hypertension     Past Surgical History:  Procedure Laterality Date   ABDOMINAL HYSTERECTOMY     DUB ovaries intact s/p bladder suspension/sling no h/o abnormal pap    CATARACT EXTRACTION, BILATERAL     right 03/29/19 and 04/14/19 Left     Family History  Problem Relation Age of Onset   Arthritis Mother    Depression Mother    Diabetes Mother    Heart disease Mother        MI, CHF   Hyperlipidemia Mother    Hypertension Mother    Diabetes Father    Heart disease Father    Stroke Father    Cancer Sister        glioblastoma   Depression Sister    Diabetes Sister    Hyperlipidemia Sister    Hypertension Sister    Mental illness Sister    Diabetes Son        type 1   Heart disease Maternal Grandmother    Heart disease Maternal Grandfather     Social History   Socioeconomic History   Marital status: Divorced    Spouse name: Not on file   Number of  children: Not on file   Years of education: Not on file   Highest education level: Not on file  Occupational History   Not on file  Tobacco Use   Smoking status: Former    Pack years: 0.00   Smokeless tobacco: Never   Tobacco comments:    quit age 64 y.o duration 12-15 years 1 ppd   Substance and Sexual Activity   Alcohol use: Yes   Drug use: Not Currently   Sexual activity: Not Currently  Other Topics Concern   Not on file  Social History Narrative   Divorced    1 son    1 dog Sadie   Associates degree, Primary school teacher    Sister lives with her    No guns   Wears seat belt, safe in relationship    Former smoker quit age 42 smoked 12-15 years total 1ppd    Social Determinants of Health   Financial Resource Strain: Not on file  Food Insecurity: Not on file  Transportation Needs: Not on file  Physical Activity: Not on file  Stress: Not on file  Social Connections: Not on file  Intimate Partner  Violence: Not on file    Outpatient Medications Prior to Visit  Medication Sig Dispense Refill   amLODipine (NORVASC) 10 MG tablet Take 1 tablet (10 mg total) by mouth daily. 90 tablet 3   atorvastatin (LIPITOR) 20 MG tablet TAKE 1 TABLET ONE TIME DAILY at night 90 tablet 3   cycloSPORINE (RESTASIS) 0.05 % ophthalmic emulsion 1 drop 2 (two) times daily.     diphenhydrAMINE-APAP, sleep, (TYLENOL PM EXTRA STRENGTH PO) Take by mouth.     fluticasone furoate-vilanterol (BREO ELLIPTA) 200-25 MCG/INH AEPB Inhale 1 puff into the lungs daily. Rinse mouth with use 180 each 3   hydrochlorothiazide (HYDRODIURIL) 25 MG tablet Take 1 tablet (25 mg total) by mouth daily. In am 90 tablet 3   Lancets (FREESTYLE) lancets Bid Use as instructed e11.9 200 each 3   losartan (COZAAR) 100 MG tablet Take 1 tablet (100 mg total) by mouth daily. 90 tablet 3   potassium chloride (KLOR-CON) 10 MEQ tablet Take 1 tablet (10 mEq total) by mouth daily. 90 tablet 3   sertraline (ZOLOFT) 100 MG  tablet Take 1 tablet (100 mg total) by mouth daily. 90 tablet 3   sitaGLIPtin (JANUVIA) 100 MG tablet Take 1 tablet (100 mg total) by mouth daily. 90 tablet 3   loratadine (CLARITIN) 10 MG tablet Take 10 mg by mouth daily. (Patient not taking: Reported on 10/29/2020)     No facility-administered medications prior to visit.    Allergies  Allergen Reactions   Amoxicillin-Pot Clavulanate Diarrhea   Ciprofloxacin Other (See Comments)    Made her more sick   Metformin Diarrhea    ROS Review of Systems  Constitutional:  Negative for diaphoresis, fatigue, fever and unexpected weight change.  Gastrointestinal:  Positive for abdominal pain. Negative for blood in stool, constipation and diarrhea.     Objective:    Physical Exam Nursing note reviewed.  Pulmonary:     Effort: Pulmonary effort is normal.  Neurological:     Mental Status: She is alert and oriented to person, place, and time.  Psychiatric:        Mood and Affect: Mood normal.        Behavior: Behavior normal.        Thought Content: Thought content normal.    Ht 5\' 2"  (1.575 m)   Wt 180 lb (81.6 kg) Comment: per patient  BMI 32.92 kg/m  Wt Readings from Last 3 Encounters:  10/29/20 180 lb (81.6 kg)  03/27/20 158 lb (71.7 kg)  09/25/19 156 lb (70.8 kg)     Health Maintenance Due  Topic Date Due   TETANUS/TDAP  Never done   PNA vac Low Risk Adult (1 of 2 - PCV13) 07/12/2019   COVID-19 Vaccine (4 - Booster for Moderna series) 07/16/2020   HEMOGLOBIN A1C  09/21/2020   FOOT EXAM  09/24/2020    There are no preventive care reminders to display for this patient.  Lab Results  Component Value Date   TSH 2.270 03/24/2020   Lab Results  Component Value Date   WBC 6.7 03/24/2020   HGB 15.0 03/24/2020   HCT 44.6 03/24/2020   MCV 96 03/24/2020   PLT 298 03/24/2020   Lab Results  Component Value Date   NA 140 03/24/2020   K 4.2 03/24/2020   CO2 23 03/24/2020   GLUCOSE 124 (H) 03/24/2020   BUN 13  03/24/2020   CREATININE 0.60 04/27/2020   BILITOT 0.9 03/24/2020   ALKPHOS 78 03/24/2020  AST 20 03/24/2020   ALT 21 03/24/2020   PROT 7.2 03/24/2020   ALBUMIN 4.6 03/24/2020   CALCIUM 9.7 03/24/2020   Lab Results  Component Value Date   CHOL 214 (H) 03/24/2020   Lab Results  Component Value Date   HDL 77 03/24/2020   Lab Results  Component Value Date   LDLCALC 114 (H) 03/24/2020   Lab Results  Component Value Date   TRIG 132 03/24/2020   Lab Results  Component Value Date   CHOLHDL 2.8 03/24/2020   Lab Results  Component Value Date   HGBA1C 5.9 (H) 03/24/2020      Assessment & Plan:   Problem List Items Addressed This Visit       Other   Lower abdominal pain - Primary    No orders of the defined types were placed in this encounter.   Follow-up: No follow-ups on file.  Patient advised to go to the emergency room immediately for evaluation and treatment of her lower abdominal pain.  Libby Maw, MD  Virtual Visit via Telephone Note  I connected with Jessica Snyder on 10/29/20 at  2:00 PM EDT by telephone and verified that I am speaking with the correct person using two identifiers.  Location: Patient: home alone Provider: work   I discussed the limitations, risks, security and privacy concerns of performing an evaluation and management service by telephone and the availability of in person appointments. I also discussed with the patient that there may be a patient responsible charge related to this service. The patient expressed understanding and agreed to proceed.   History of Present Illness:    Observations/Objective:   Assessment and Plan:   Follow Up Instructions:    I discussed the assessment and treatment plan with the patient. The patient was provided an opportunity to ask questions and all were answered. The patient agreed with the plan and demonstrated an understanding of the instructions.   The patient was advised to call  back or seek an in-person evaluation if the symptoms worsen or if the condition fails to improve as anticipated.  I provided 20 minutes of non-face-to-face time during this encounter.   Libby Maw, MD   Interactive video and audio telecommunications were attempted between myself and the patient. However they failed due to the patient having technical difficulties or not having access to video capability. We continued and completed with audio only.

## 2020-11-13 NOTE — ED Provider Notes (Signed)
[facility] Emergency Department Provider Note ____________________________________________   Event Date/Time   First MD Initiated Contact with Patient 10/29/20 1508     (approximate)  I have reviewed the triage vital signs and the nursing notes.   HISTORY  Chief Complaint Abdominal Pain  HPI Jessica Snyder is a 65 y.o. female with history of diverticulitis presents to the emergency department for treatment and evaluation of lower abdominal pain that started yesterday. No alleviating measures prior to arrival.         Past Medical History:  Diagnosis Date   Allergy    Arthritis    hands    Asthma    Colon polyps    Depression    Diabetes mellitus without complication ([place])    Diverticulitis    Fatty liver    History of chicken pox    Hyperlipidemia    Hypertension     Patient Active Problem List   Diagnosis Date Noted   Thyroid nodule 04/28/2020   Cervical adenopathy 04/13/2020   Bilateral dry eyes 03/27/2020   Hypertension associated with diabetes ([place]) 03/27/2020   Anxiety and depression 03/27/2020   Depression, recurrent ([place]) 03/27/2020   Anxiety with flying 09/25/2019   Fatty liver 02/13/2019   Elevated liver function tests 02/13/2019   Leg cramps 09/04/2018   Lower abdominal pain 05/18/2018   Vitamin D deficiency 05/11/2018   Asthma 05/04/2018   Osteoarthritis 05/04/2018   Diverticulosis 05/04/2018   Depression 05/04/2018   DM2 (diabetes mellitus, type 2) ([place]) 05/04/2018   HTN (hypertension) 05/04/2018   Constipation 05/04/2018    Past Surgical History:  Procedure Laterality Date   ABDOMINAL HYSTERECTOMY     DUB ovaries intact s/p bladder suspension/sling no h/o abnormal pap    CATARACT EXTRACTION, BILATERAL     right 03/29/19 and 04/14/19 Left     Prior to Admission medications   Medication Sig Start Date End Date Taking? Authorizing Provider  amLODipine (NORVASC) 10 MG tablet Take 1 tablet (10 mg total) by mouth  daily. 03/11/20   McLean-Scocuzza, Nino Glow, MD  atorvastatin (LIPITOR) 20 MG tablet TAKE 1 TABLET ONE TIME DAILY at night 03/12/20   McLean-Scocuzza, Nino Glow, MD  cycloSPORINE (RESTASIS) 0.05 % ophthalmic emulsion 1 drop 2 (two) times daily.    [provider]  diphenhydrAMINE-APAP, sleep, (TYLENOL PM EXTRA STRENGTH PO) Take by mouth.    [provider]  fluticasone furoate-vilanterol (BREO ELLIPTA) 200-25 MCG/INH AEPB Inhale 1 puff into the lungs daily. Rinse mouth with use 03/27/20   McLean-Scocuzza, Nino Glow, MD  hydrochlorothiazide (HYDRODIURIL) 25 MG tablet Take 1 tablet (25 mg total) by mouth daily. In am 12/01/19   McLean-Scocuzza, Nino Glow, MD  Lancets (FREESTYLE) lancets Bid Use as instructed e11.9 07/25/19   McLean-Scocuzza, Nino Glow, MD  losartan (COZAAR) 100 MG tablet Take 1 tablet (100 mg total) by mouth daily. 03/12/20   McLean-Scocuzza, Nino Glow, MD  potassium chloride (KLOR-CON) 10 MEQ tablet Take 1 tablet (10 mEq total) by mouth daily. 12/01/19   McLean-Scocuzza, Nino Glow, MD  sertraline (ZOLOFT) 100 MG tablet Take 1 tablet (100 mg total) by mouth daily. 07/23/20   McLean-Scocuzza, Nino Glow, MD  sitaGLIPtin (JANUVIA) 100 MG tablet Take 1 tablet (100 mg total) by mouth daily. 01/20/20   McLean-Scocuzza, Nino Glow, MD    Allergies Amoxicillin-pot clavulanate, Ciprofloxacin, and Metformin  Family History  Problem Relation Age of Onset   Arthritis Mother    Depression Mother    Diabetes  Mother    Heart disease Mother        MI, CHF   Hyperlipidemia Mother    Hypertension Mother    Diabetes Father    Heart disease Father    Stroke Father    Cancer Sister        glioblastoma   Depression Sister    Diabetes Sister    Hyperlipidemia Sister    Hypertension Sister    Mental illness Sister    Diabetes Son        type 1   Heart disease Maternal Grandmother    Heart disease Maternal Grandfather     Social History Social History   Tobacco Use   Smoking status: Former     Pack years: 0.00   Smokeless tobacco: Never   Tobacco comments:    quit age 65 y.o duration 12-15 years 1 ppd   Substance Use Topics   Alcohol use: Yes   Drug use: Not Currently    Review of Systems  Constitutional: No fever/chills Eyes: No visual changes. ENT: No sore throat. Cardiovascular: Denies chest pain. Respiratory: Denies shortness of breath. Gastrointestinal: Positive for abdominal pain.  No nausea, no vomiting.  No diarrhea.  No constipation. Genitourinary: Negative for dysuria. Musculoskeletal: Negative for back pain. Skin: Negative for rash. Neurological: Negative for headaches, focal weakness or numbness. ____________________________________________   PHYSICAL EXAM:  VITAL SIGNS: ED Triage Vitals  Enc Vitals Group     BP 10/29/20 1509 140/85     Pulse Rate 10/29/20 1509 72     Resp 10/29/20 1509 17     Temp 10/29/20 1509 98.2 F (36.8 C)     Temp Source 10/29/20 1509 Oral     SpO2 10/29/20 1509 95 %     Weight 10/29/20 1505 180 lb (81.6 kg)     Height 10/29/20 1505 5\' 2"  (1.575 m)     Head Circumference --      Peak Flow --      Pain Score 10/29/20 1505 7     Pain Loc --      Pain Edu? --      Excl. in [place]? --     Constitutional: Alert and oriented. Well appearing and in no acute distress. Eyes: Conjunctivae are normal.  Head: Atraumatic. Nose: No congestion/rhinnorhea. Mouth/Throat: Mucous membranes are moist.  Oropharynx non-erythematous. Neck: No stridor.   Hematological/Lymphatic/Immunilogical: No cervical lymphadenopathy. Cardiovascular: Normal rate, regular rhythm. Grossly normal heart sounds.  Good peripheral circulation. Respiratory: Normal respiratory effort.  No retractions. Lungs CTAB. Gastrointestinal: Soft, tender llq. No distention. No abdominal bruits.  Genitourinary:  Musculoskeletal: No lower extremity tenderness nor edema.  No joint effusions. Neurologic:  Normal speech and language. No gross focal neurologic deficits are  appreciated. No gait instability. Skin:  Skin is warm, dry and intact. No rash noted. Psychiatric: Mood and affect are normal. Speech and behavior are normal.  ____________________________________________   LABS (all labs ordered are listed, but only abnormal results are displayed)  Labs Reviewed  COMPREHENSIVE METABOLIC PANEL - Abnormal; Notable for the following components:      Result Value   Glucose, Bld 161 (*)    All other components within normal limits  URINALYSIS, COMPLETE (UACMP) WITH MICROSCOPIC - Abnormal; Notable for the following components:   Color, Urine STRAW (*)    APPearance CLEAR (*)    Specific Gravity, Urine 1.002 (*)    Hgb urine dipstick SMALL (*)    Leukocytes,Ua TRACE (*)    All  other components within normal limits  LIPASE, BLOOD  CBC   ____________________________________________  EKG  Not indicated. ____________________________________________  RADIOLOGY  ED MD interpretation:    Acute sigmoid diverticulitis without perforation or abscess.   I, Sherrie George, personally viewed and evaluated these images (plain radiographs) as part of my medical decision making, as well as reviewing the written report by the radiologist.  Official radiology report(s): No results found.  ____________________________________________   PROCEDURES  Procedure(s) performed (including Critical Care):  Procedures  ____________________________________________   INITIAL IMPRESSION / ASSESSMENT AND PLAN     65 year old female presents to the emergency department for treatment and evaluation of abdominal pain. See HPI. Will get CT abdomen and pelvis and review labs.   DIFFERENTIAL DIAGNOSIS  Diverticulosis/itis, colitis  ED COURSE  CT shows diverticulitis. Will treat with keflex--due to cipro allergy and flagyl.   Advised to follow up with GI or PCP.    ___________________________________________   FINAL CLINICAL IMPRESSION(S) / ED  DIAGNOSES  Final diagnoses:  Acute diverticulitis     ED Discharge Orders          Ordered    metroNIDAZOLE (FLAGYL) 500 MG tablet  3 times daily,   Status:  Discontinued        10/29/20 1717    cephALEXin (KEFLEX) 500 MG capsule  3 times daily,   Status:  Discontinued        10/29/20 1717    cephALEXin (KEFLEX) 500 MG capsule  3 times daily,   Status:  Discontinued        10/29/20 1719    metroNIDAZOLE (FLAGYL) 500 MG tablet  3 times daily        10/29/20 1719    cephALEXin (KEFLEX) 500 MG capsule  3 times daily        10/29/20 1720             Lashon Beringer was evaluated in Emergency Department on 11/13/2020 for the symptoms described in the history of present illness. She was evaluated in the context of the global COVID-19 pandemic, which necessitated consideration that the patient might be at risk for infection with the SARS-CoV-2 virus that causes COVID-19. Institutional protocols and algorithms that pertain to the evaluation of patients at risk for COVID-19 are in a state of rapid change based on information released by regulatory bodies including the CDC and federal and state organizations. These policies and algorithms were followed during the patient's care in the ED.   Note:  This document was prepared using Dragon voice recognition software and may include unintentional dictation errors.    Victorino Dike, FNP 11/13/20 1512    Harvest Dark, MD 11/17/20 1242

## 2020-11-17 ENCOUNTER — Ambulatory Visit (INDEPENDENT_AMBULATORY_CARE_PROVIDER_SITE_OTHER): Payer: 59 | Admitting: Internal Medicine

## 2020-11-17 ENCOUNTER — Encounter: Payer: Self-pay | Admitting: Internal Medicine

## 2020-11-17 ENCOUNTER — Other Ambulatory Visit: Payer: Self-pay

## 2020-11-17 VITALS — BP 126/68 | HR 100 | Temp 97.1°F | Ht 62.0 in | Wt 183.4 lb

## 2020-11-17 DIAGNOSIS — R238 Other skin changes: Secondary | ICD-10-CM | POA: Diagnosis not present

## 2020-11-17 DIAGNOSIS — K5792 Diverticulitis of intestine, part unspecified, without perforation or abscess without bleeding: Secondary | ICD-10-CM | POA: Diagnosis not present

## 2020-11-17 DIAGNOSIS — Z23 Encounter for immunization: Secondary | ICD-10-CM | POA: Diagnosis not present

## 2020-11-17 DIAGNOSIS — E559 Vitamin D deficiency, unspecified: Secondary | ICD-10-CM

## 2020-11-17 DIAGNOSIS — Z1283 Encounter for screening for malignant neoplasm of skin: Secondary | ICD-10-CM | POA: Diagnosis not present

## 2020-11-17 DIAGNOSIS — I10 Essential (primary) hypertension: Secondary | ICD-10-CM

## 2020-11-17 DIAGNOSIS — Z0000 Encounter for general adult medical examination without abnormal findings: Secondary | ICD-10-CM | POA: Insufficient documentation

## 2020-11-17 DIAGNOSIS — E041 Nontoxic single thyroid nodule: Secondary | ICD-10-CM

## 2020-11-17 DIAGNOSIS — E1159 Type 2 diabetes mellitus with other circulatory complications: Secondary | ICD-10-CM

## 2020-11-17 DIAGNOSIS — I152 Hypertension secondary to endocrine disorders: Secondary | ICD-10-CM

## 2020-11-17 DIAGNOSIS — E876 Hypokalemia: Secondary | ICD-10-CM

## 2020-11-17 DIAGNOSIS — E1165 Type 2 diabetes mellitus with hyperglycemia: Secondary | ICD-10-CM

## 2020-11-17 LAB — POCT GLYCOSYLATED HEMOGLOBIN (HGB A1C): Hemoglobin A1C: 6.3 % — AB (ref 4.0–5.6)

## 2020-11-17 MED ORDER — SITAGLIPTIN PHOSPHATE 100 MG PO TABS: 100.0000 mg | ORAL_TABLET | Freq: Every day | ORAL | 3 refills | Status: DC

## 2020-11-17 MED ORDER — POTASSIUM CHLORIDE ER 10 MEQ PO TBCR: 10.0000 meq | EXTENDED_RELEASE_TABLET | Freq: Every day | ORAL | 3 refills | Status: DC

## 2020-11-17 MED ORDER — HYDROCHLOROTHIAZIDE 25 MG PO TABS: 25.0000 mg | ORAL_TABLET | Freq: Every day | ORAL | 3 refills | Status: DC

## 2020-11-17 NOTE — Patient Instructions (Addendum)
Consider 4th covid 19 shot   Cetaphil or cerave  Dr.Kerr (Endocrine) Jessica Snyder 07/2021  GI 07/2021    Call back late 02/2021 for referral to GI for colonoscopy   [facility] [place] [address] [phone]   [facility] [place] [address] [place] [address] [phone]   Pneumococcal Conjugate Vaccine (Prevnar 13) Suspension for Injection What is this medication? PNEUMOCOCCAL VACCINE (NEU mo KOK al vak SEEN) is a vaccine used to prevent pneumococcus bacterial infections. These bacteria can cause serious infections like pneumonia, meningitis, and blood infections. This vaccine will lower your chance of getting pneumonia. If you do get pneumonia, it can make your symptoms milder and your illness shorter. This vaccine will not treat an infection and will not cause infection. This vaccine is recommended for infants and youngchildren, adults with certain medical conditions, and adults 60 years or older. This medicine may be used for other purposes; ask your health care provider orpharmacist if you have questions. COMMON BRAND NAME(S): Prevnar, Prevnar 13 What should I tell my care team before I take this medication? They need to know if you have any of these conditions: bleeding problems fever immune system problems an unusual or allergic reaction to pneumococcal vaccine, diphtheria toxoid, other vaccines, latex, other medicines, foods, dyes, or preservatives pregnant or trying to get pregnant breast-feeding How should I use this medication? This vaccine is for injection into a muscle. It is given by a health careprofessional. A copy of Vaccine Information Statements will be given before each vaccination.Read this sheet carefully each time. The sheet may change frequently. Talk to your pediatrician regarding the use of this medicine in children. While this drug may be  prescribed for children as young as 5 weeks old for selectedconditions, precautions do apply. Overdosage: If you think you have taken too much of this medicine contact apoison control center or emergency room at once. NOTE: This medicine is only for you. Do not share this medicine with others. What if I miss a dose? It is important not to miss your dose. Call your doctor or health careprofessional if you are unable to keep an appointment. What may interact with this medication? medicines for cancer chemotherapy medicines that suppress your immune function steroid medicines like prednisone or cortisone This list may not describe all possible interactions. Give your health care provider a list of all the medicines, herbs, non-prescription drugs, or dietary supplements you use. Also tell them if you smoke, drink alcohol, or use illegaldrugs. Some items may interact with your medicine. What should I watch for while using this medication? Mild fever and pain should go away in 3 days or less. Report any unusualsymptoms to your doctor or health care professional. What side effects may I notice from receiving this medication? Side effects that you should report to your doctor or health care professionalas soon as possible: allergic reactions like skin rash, itching or hives, swelling of the face, lips, or tongue breathing problems confused fast or irregular heartbeat fever over 102 degrees F seizures unusual bleeding or bruising unusual muscle weakness Side effects that usually do not require medical attention (report to yourdoctor or health care professional if they continue or are bothersome): aches and pains diarrhea fever of 102 degrees F or less headache irritable loss of appetite pain, tender at site where injected trouble sleeping This list may not describe all possible side effects. Call your doctor  for medical advice about side effects. You may report side effects to FDA  at1-800-FDA-1088. Where should I keep my medication? This does not apply. This vaccine is given in a clinic, pharmacy, doctor'soffice, or other health care setting and will not be stored at home. NOTE: This sheet is a summary. It may not cover all possible information. If you have questions about this medicine, talk to your doctor, pharmacist, orhealth care provider.  2022 Elsevier/Gold Standard (2014-01-30 10:27:27)

## 2020-11-17 NOTE — Progress Notes (Signed)
Chief Complaint  Patient presents with   Annual Exam   Annual  1. C/o skin discoloration right of nose new used new moisturizer recently agreeable to dermatology referral  2. H/o DM 2 A1C 6.3 today from 5.9 she is drinking wine with htn on norvasc 10 mg qd, lipitor 20 mg qhs hctz 25 mg qd losartan 100 mg qd on januvia 100 mg qd  3. Thyroid nodules s/p 2 thyroid biopsies with [facility] endocrine she does not want to go back there  4. Recurrent diverticulitis last colonoscopy in 2018 will have pt f/u GI will refer in 2 months she wants to hold for now    Review of Systems  Constitutional:  Negative for weight loss.  HENT:  Negative for hearing loss.   Eyes:  Negative for blurred vision.  Respiratory:  Negative for shortness of breath.   Cardiovascular:  Negative for chest pain.  Gastrointestinal:  Negative for abdominal pain.  Musculoskeletal:  Negative for back pain and joint pain.  Skin:  Positive for rash.  Neurological:  Negative for headaches.  Psychiatric/Behavioral:         +stress with sister    Past Medical History:  Diagnosis Date   Allergy    Arthritis    hands    Asthma    Colon polyps    Depression    Diabetes mellitus without complication ([place])    Diverticulitis    Fatty liver    History of chicken pox    Hyperlipidemia    Hypertension    Past Surgical History:  Procedure Laterality Date   ABDOMINAL HYSTERECTOMY     DUB ovaries intact s/p bladder suspension/sling no h/o abnormal pap    CATARACT EXTRACTION, BILATERAL     right 03/29/19 and 04/14/19 Left    Family History  Problem Relation Age of Onset   Arthritis Mother    Depression Mother    Diabetes Mother    Heart disease Mother        MI, CHF   Hyperlipidemia Mother    Hypertension Mother    Diabetes Father    Heart disease Father    Stroke Father    Cancer Sister        glioblastoma   Depression Sister    Diabetes Sister    Hyperlipidemia Sister    Hypertension Sister    Mental  illness Sister    Diabetes Son        type 1   Heart disease Maternal Grandmother    Heart disease Maternal Grandfather    Social History   Socioeconomic History   Marital status: Divorced    Spouse name: Not on file   Number of children: Not on file   Years of education: Not on file   Highest education level: Not on file  Occupational History   Not on file  Tobacco Use   Smoking status: Former    Pack years: 0.00   Smokeless tobacco: Never   Tobacco comments:    quit age 38 y.o duration 12-15 years 1 ppd   Substance and Sexual Activity   Alcohol use: Yes   Drug use: Not Currently   Sexual activity: Not Currently  Other Topics Concern   Not on file  Social History Narrative   Divorced    1 son    1 dog Sadie   Associates degree, [employer] medical coding educator    Sister lives with her    No guns   Wears seat  belt, safe in relationship    Former smoker quit age 28 smoked 12-15 years total 1ppd    Social Determinants of Health   Financial Resource Strain: Not on file  Food Insecurity: Not on file  Transportation Needs: Not on file  Physical Activity: Not on file  Stress: Not on file  Social Connections: Not on file  Intimate Partner Violence: Not on file   Current Meds  Medication Sig   amLODipine (NORVASC) 10 MG tablet Take 1 tablet (10 mg total) by mouth daily.   atorvastatin (LIPITOR) 20 MG tablet TAKE 1 TABLET ONE TIME DAILY at night   cycloSPORINE (RESTASIS) 0.05 % ophthalmic emulsion 1 drop 2 (two) times daily.   diphenhydrAMINE-APAP, sleep, (TYLENOL PM EXTRA STRENGTH PO) Take by mouth.   fluticasone furoate-vilanterol (BREO ELLIPTA) 200-25 MCG/INH AEPB Inhale 1 puff into the lungs daily. Rinse mouth with use   Lancets (FREESTYLE) lancets Bid Use as instructed e11.9   losartan (COZAAR) 100 MG tablet Take 1 tablet (100 mg total) by mouth daily.   sertraline (ZOLOFT) 100 MG tablet Take 1 tablet (100 mg total) by mouth daily.   [DISCONTINUED]  hydrochlorothiazide (HYDRODIURIL) 25 MG tablet Take 1 tablet (25 mg total) by mouth daily. In am   [DISCONTINUED] potassium chloride (KLOR-CON) 10 MEQ tablet Take 1 tablet (10 mEq total) by mouth daily.   [DISCONTINUED] sitaGLIPtin (JANUVIA) 100 MG tablet Take 1 tablet (100 mg total) by mouth daily.   Allergies  Allergen Reactions   Amoxicillin-Pot Clavulanate Diarrhea   Ciprofloxacin Other (See Comments)    Made her more sick   Metformin Diarrhea   Recent Results (from the past 2160 hour(s))  Lipase, blood     Status: None   Collection Time: 10/29/20  3:17 PM  Result Value Ref Range   Lipase 32 11 - 51 U/L    Comment: Performed at [facility], [address] [place], [place] [address]  Comprehensive metabolic panel     Status: Abnormal   Collection Time: 10/29/20  3:17 PM  Result Value Ref Range   Sodium 137 135 - 145 mmol/L   Potassium 3.8 3.5 - 5.1 mmol/L   Chloride 103 98 - 111 mmol/L   CO2 26 22 - 32 mmol/L   Glucose, Bld 161 (H) 70 - 99 mg/dL    Comment: Glucose reference range applies only to samples taken after fasting for at least 8 hours.   BUN 15 8 - 23 mg/dL   Creatinine, Ser 0.60 0.44 - 1.00 mg/dL   Calcium 9.7 8.9 - 10.3 mg/dL   Total Protein 7.7 6.5 - 8.1 g/dL   Albumin 4.3 3.5 - 5.0 g/dL   AST 21 15 - 41 U/L   ALT 24 0 - 44 U/L   Alkaline Phosphatase 61 38 - 126 U/L   Total Bilirubin 1.1 0.3 - 1.2 mg/dL   GFR, Estimated >60 >60 mL/min    Comment: (NOTE) Calculated using the CKD-EPI Creatinine Equation (2021)    Anion gap 8 5 - 15    Comment: Performed at [facility], [address] [place], [place] [address]  CBC     Status: None   Collection Time: 10/29/20  3:17 PM  Result Value Ref Range   WBC 7.9 4.0 - 10.5 K/uL   RBC 4.19 3.87 - 5.11 MIL/uL   Hemoglobin 13.7 12.0 - 15.0 g/dL   HCT 38.8 36.0 - 46.0 %   MCV 92.6 80.0 - 100.0 fL   MCH  32.7 26.0 - 34.0 pg   MCHC 35.3 30.0 - 36.0 g/dL   RDW 11.9 11.5 - 15.5 %   Platelets 284  150 - 400 K/uL   nRBC 0.0 0.0 - 0.2 %    Comment: Performed at [facility], [address] [place], [place] [address]  Urinalysis, Complete w Microscopic Urine, Clean Catch     Status: Abnormal   Collection Time: 10/29/20  3:17 PM  Result Value Ref Range   Color, Urine STRAW (A) YELLOW   APPearance CLEAR (A) CLEAR   Specific Gravity, Urine 1.002 (L) 1.005 - 1.030   pH 6.0 5.0 - 8.0   Glucose, UA NEGATIVE NEGATIVE mg/dL   Hgb urine dipstick SMALL (A) NEGATIVE   Bilirubin Urine NEGATIVE NEGATIVE   Ketones, ur NEGATIVE NEGATIVE mg/dL   Protein, ur NEGATIVE NEGATIVE mg/dL   Nitrite NEGATIVE NEGATIVE   Leukocytes,Ua TRACE (A) NEGATIVE   RBC / HPF 0-5 0 - 5 RBC/hpf   WBC, UA 0-5 0 - 5 WBC/hpf   Bacteria, UA NONE SEEN NONE SEEN   Squamous Epithelial / LPF 0-5 0 - 5    Comment: Performed at [facility], [address] [place], [place] [address]  POCT glycosylated hemoglobin (Hb A1C)     Status: Abnormal   Collection Time: 11/17/20  2:32 PM  Result Value Ref Range   Hemoglobin A1C 6.3 (A) 4.0 - 5.6 %   HbA1c POC (<> result, manual entry)     HbA1c, POC (prediabetic range)     HbA1c, POC (controlled diabetic range)     Objective  Body mass index is 33.54 kg/m. Wt Readings from Last 3 Encounters:  11/17/20 183 lb 6.4 oz (83.2 kg)  10/29/20 180 lb (81.6 kg)  10/29/20 180 lb (81.6 kg)   Temp Readings from Last 3 Encounters:  11/17/20 (!) 97.1 F (36.2 C) (Skin)  10/29/20 98.2 F (36.8 C) (Oral)  03/27/20 97.6 F (36.4 C) (Oral)   BP Readings from Last 3 Encounters:  11/17/20 126/68  10/29/20 140/85  03/27/20 130/88   Pulse Readings from Last 3 Encounters:  11/17/20 100  10/29/20 72  03/27/20 76    Physical Exam Vitals and nursing note reviewed.  Constitutional:      Appearance: Normal appearance. She is well-developed and well-groomed. She is obese.  HENT:     Head: Normocephalic and atraumatic.  Eyes:     Conjunctiva/sclera: Conjunctivae  normal.     Pupils: Pupils are equal, round, and reactive to light.  Cardiovascular:     Rate and Rhythm: Normal rate and regular rhythm.     Heart sounds: Normal heart sounds. No murmur heard. Pulmonary:     Effort: Pulmonary effort is normal.     Breath sounds: Normal breath sounds.  Abdominal:     Tenderness: There is no abdominal tenderness.  Skin:    General: Skin is warm and dry.  Neurological:     General: No focal deficit present.     Mental Status: She is alert and oriented to person, place, and time. Mental status is at baseline.     Gait: Gait normal.  Psychiatric:        Attention and Perception: Attention and perception normal.        Mood and Affect: Mood and affect normal.        Speech: Speech normal.        Behavior: Behavior normal. Behavior is cooperative.  Thought Content: Thought content normal.        Cognition and Memory: Cognition and memory normal.        Judgment: Judgment normal.    Assessment  Plan  Annual physical exam Hep A/B 04/2020, Prevnar 05/2020, 06/2020 Tdap Flu shot utd -utd pna 23 -prevnar 13 x 1 given 11/17/20 Rx today 11/17/20 Tdap  -rec twinrix had 2/3 doses with h/o fatty liver will need in the future   had shingrix 2/2 moderna 3/3 consider booster in future Declines MMR vx   S/p hysterectomy DUB no h/o abnormal pap no need further pap mammo neg 02/06/19 ordered 06/04/20 neg  Colonoscopy 07/07/16 tubular adenoma neg path repeat in 5 years consider referral in 02/2021, 11/17/20 10/29/20 CT + diverticulitis with h/o recurrence  DEXA 07/10/19 normal  Hep C neg 02/24/15  Skin referred [place] [facility] healthy diet and exercise    Pulm [facility] Delma Freeze [facility] dentist Former PCP Dr. [place]us Ped MD 12/21/17 last seen   Change of skin color - Plan: Ambulatory referral to Dermatology Skin cancer screening - Plan: Ambulatory referral to Dermatology  Diverticulitis Wants referral GI by 07/2021 will  let me know when ready for referral  Thyroid nodule - Plan: Ambulatory referral to Endocrinology Need bx report x 2 [place] [facility]   Hypertension associated with diabetes ([place]) - Plan: POCT glycosylated hemoglobin (Hb A1C) Type 2 diabetes mellitus with hyperglycemia, without long-term current use of insulin ([place]) - Plan: sitaGLIPtin (JANUVIA) 100 MG tablet  Hypokalemia - Plan: potassium chloride (KLOR-CON) 10 MEQ tablet   [employer] mail order refill [address]   Provider: Dr. Olivia Mackie [facility]

## 2020-11-17 NOTE — Progress Notes (Signed)
Pre visit review using our clinic review tool, if applicable. No additional management support is needed unless otherwise documented below in the visit note. 

## 2021-01-14 ENCOUNTER — Telehealth: Payer: Self-pay | Admitting: Internal Medicine

## 2021-01-14 NOTE — Telephone Encounter (Signed)
Patient is calling to set up a ear lavage and a tetanus shot.Please add the orders for the patient.

## 2021-01-14 NOTE — Telephone Encounter (Signed)
I do not see where she has been seen.  I see no exam or recommendation for ear lavage.  She would need to be seen and determine if ear lavage needed.  Symptoms?  Needs evaluation.

## 2021-01-15 NOTE — Telephone Encounter (Signed)
Called to speak with Jessica Snyder. She states that she was told by Dr. Olivia Mackie to come in whenever to receive an ear lavage. I explained that Dr. Olivia Mackie was not in the office and that we do not see the orders. [name] states that she has attempted to remove the wax from her ears twice with our offices help and nothing has come out. Pt verbalized understanding that orders were not entered into the chart and asked if we could check again, otherwise it is fine and she is able to wait until Dr. Olivia Mackie returns. Pt declines an appointment and being evaluated by another doctor for her ear wax. Did not see a note from Dr. Olivia Mackie requesting a nurse visit. Pt states she does not need a call back.

## 2021-03-17 ENCOUNTER — Other Ambulatory Visit: Payer: Self-pay | Admitting: Internal Medicine

## 2021-03-17 DIAGNOSIS — J45909 Unspecified asthma, uncomplicated: Secondary | ICD-10-CM

## 2021-03-17 DIAGNOSIS — I10 Essential (primary) hypertension: Secondary | ICD-10-CM

## 2021-03-17 DIAGNOSIS — E785 Hyperlipidemia, unspecified: Secondary | ICD-10-CM

## 2021-03-25 ENCOUNTER — Encounter: Payer: Self-pay | Admitting: Internal Medicine

## 2021-03-25 LAB — HM DIABETES EYE EXAM

## 2021-03-31 ENCOUNTER — Telehealth: Payer: Self-pay | Admitting: Internal Medicine

## 2021-03-31 ENCOUNTER — Ambulatory Visit: Payer: 59 | Admitting: Internal Medicine

## 2021-03-31 NOTE — Telephone Encounter (Signed)
Patient no-showed today's appointment; appointment was for 03/31/21, provider notified for review of record. Letter sent for patient to call in and re-schedule.

## 2021-05-28 ENCOUNTER — Encounter: Payer: Self-pay | Admitting: Internal Medicine

## 2021-05-28 ENCOUNTER — Ambulatory Visit: Payer: 59 | Admitting: Internal Medicine

## 2021-05-28 ENCOUNTER — Other Ambulatory Visit: Payer: Self-pay

## 2021-05-28 VITALS — BP 142/80 | HR 107 | Temp 98.2°F | Ht 62.0 in | Wt 189.6 lb

## 2021-05-28 DIAGNOSIS — I152 Hypertension secondary to endocrine disorders: Secondary | ICD-10-CM

## 2021-05-28 DIAGNOSIS — Z23 Encounter for immunization: Secondary | ICD-10-CM

## 2021-05-28 DIAGNOSIS — Z658 Other specified problems related to psychosocial circumstances: Secondary | ICD-10-CM

## 2021-05-28 DIAGNOSIS — Z1211 Encounter for screening for malignant neoplasm of colon: Secondary | ICD-10-CM

## 2021-05-28 DIAGNOSIS — E1159 Type 2 diabetes mellitus with other circulatory complications: Secondary | ICD-10-CM | POA: Diagnosis not present

## 2021-05-28 DIAGNOSIS — R32 Unspecified urinary incontinence: Secondary | ICD-10-CM

## 2021-05-28 DIAGNOSIS — Z1231 Encounter for screening mammogram for malignant neoplasm of breast: Secondary | ICD-10-CM | POA: Diagnosis not present

## 2021-05-28 DIAGNOSIS — I10 Essential (primary) hypertension: Secondary | ICD-10-CM

## 2021-05-28 DIAGNOSIS — E042 Nontoxic multinodular goiter: Secondary | ICD-10-CM | POA: Insufficient documentation

## 2021-05-28 MED ORDER — TETANUS-DIPHTH-ACELL PERTUSSIS 5-2.5-18.5 LF-MCG/0.5 IM SUSP: 0.5000 mL | Freq: Once | INTRAMUSCULAR | 0 refills | Status: AC

## 2021-05-28 MED ORDER — SERTRALINE HCL 100 MG PO TABS
100.0000 mg | ORAL_TABLET | Freq: Every day | ORAL | 3 refills | Status: DC
Start: 2021-05-28 — End: 2021-11-25

## 2021-05-28 MED ORDER — SPIRONOLACTONE 25 MG PO TABS: 25.0000 mg | ORAL_TABLET | Freq: Every day | ORAL | 3 refills | Status: DC

## 2021-05-28 NOTE — Patient Instructions (Addendum)
Dr. Buddy Duty sch 07/05/21 at 2 pm   [phone] Not available [address]   [address] [place] [address]      Specialties     Endocrinology      MD Physician   [facility] clinic GI  Dr. Joselyn Arrow Phone Fax E-mail Address  [phone] Not available [place] [address]     Specialties     Gastroenterology             Tirzepatide Injection What is this medication? TIRZEPATIDE (tir ZEP a tide) treats type 2 diabetes. It works by increasing insulin levels in your body, which decreases your blood sugar (glucose). Changes to diet and exercise are often combined with this medication. This medicine may be used for other purposes; ask your health care provider or pharmacist if you have questions. COMMON BRAND NAME(S): MOUNJARO What should I tell my care team before I take this medication? They need to know if you have any of these conditions: Endocrine tumors (MEN 2) or if someone in your family had these tumors Eye disease, vision problems Gallbladder disease History of pancreatitis Kidney disease Stomach or intestine problems Thyroid cancer or if someone in your family had thyroid cancer An unusual or allergic reaction to tirzepatide, other medications, foods, dyes, or preservatives Pregnant or trying to get pregnant Breast-feeding How should I use this medication? This medication is injected under the skin. You will be taught how to prepare and give it. It is given once every week (every 7 days). Keep taking it unless your health care provider tells you to stop. If you use this medication with insulin, you should inject this medication and the insulin separately. Do not mix them together. Do not give the injections right next to each other. Change (rotate) injection sites with each injection. This medication comes with INSTRUCTIONS FOR USE. Ask your pharmacist for directions on how to use this medication. Read the  information carefully. Talk to your pharmacist or care team if you have questions. It is important that you put your used needles and syringes in a special sharps container. Do not put them in a trash can. If you do not have a sharps container, call your pharmacist or care team to get one. A special MedGuide will be given to you by the pharmacist with each prescription and refill. Be sure to read this information carefully each time. Talk to your care team about the use of this medication in children. Special care may be needed. Overdosage: If you think you have taken too much of this medicine contact a poison control center or emergency room at once. NOTE: This medicine is only for you. Do not share this medicine with others. What if I miss a dose? If you miss a dose, take it as soon as you can unless it is more than 4 days (96 hours) late. If it is more than 4 days late, skip the missed dose. Take the next dose at the normal time. Do not take 2 doses within 3 days of each other. What may interact with this medication? Alcohol containing beverages Antiviral medications for HIV or AIDS Aspirin and aspirin-like medications Beta-blockers like atenolol, metoprolol, propranolol Certain medications for blood pressure, heart disease, irregular heart beat Chromium Clonidine Diuretics Female hormones, such as estrogens or progestins, birth control pills Fenofibrate Gemfibrozil Guanethidine Isoniazid Lanreotide Female hormones or anabolic steroids MAOIs like Carbex, Eldepryl, Marplan, Nardil, and Parnate Medications for  weight loss Medications for allergies, asthma, cold, or cough Medications for depression, anxiety, or psychotic disturbances Niacin Nicotine NSAIDs, medications for pain and inflammation, like ibuprofen or naproxen Octreotide Other medications for diabetes, like glyburide, glipizide, or glimepiride Pasireotide Pentamidine Phenytoin Probenecid Quinolone antibiotics such as  ciprofloxacin, levofloxacin, ofloxacin Reserpine Some herbal dietary supplements Steroid medications such as prednisone or cortisone Sulfamethoxazole; trimethoprim Thyroid hormones Warfarin This list may not describe all possible interactions. Give your health care provider a list of all the medicines, herbs, non-prescription drugs, or dietary supplements you use. Also tell them if you smoke, drink alcohol, or use illegal drugs. Some items may interact with your medicine. What should I watch for while using this medication? Visit your care team for regular checks on your progress. Drink plenty of fluids while taking this medication. Check with your care team if you get an attack of severe diarrhea, nausea, and vomiting. The loss of too much body fluid can make it dangerous for you to take this medication. A test called the HbA1C (A1C) will be monitored. This is a simple blood test. It measures your blood sugar control over the last 2 to 3 months. You will receive this test every 3 to 6 months. Learn how to check your blood sugar. Learn the symptoms of low and high blood sugar and how to manage them. Always carry a quick-source of sugar with you in case you have symptoms of low blood sugar. Examples include hard sugar candy or glucose tablets. Make sure others know that you can choke if you eat or drink when you develop serious symptoms of low blood sugar, such as seizures or unconsciousness. They must get medical help at once. Tell your care team if you have high blood sugar. You might need to change the dose of your medication. If you are sick or exercising more than usual, you might need to change the dose of your medication. Do not skip meals. Ask your care team if you should avoid alcohol. Many nonprescription cough and cold products contain sugar or alcohol. These can affect blood sugar. Pens should never be shared. Even if the needle is changed, sharing may result in passing of viruses like  hepatitis or HIV. Wear a medical ID bracelet or chain, and carry a card that describes your disease and details of your medication and dosage times. Birth control may not work properly while you are taking this medication. If you take birth control pills by mouth, your care team may recommend another type of birth control for 4 weeks after you start this medication and for 4 weeks after each increase in your dose of this medication. Ask your care team which birth control methods you should use. What side effects may I notice from receiving this medication? Side effects that you should report to your care team as soon as possible: Allergic reactions--skin rash, itching, hives, swelling of the face, lips, tongue, or throat Change in vision Dehydration--increased thirst, dry mouth, feeling faint or lightheaded, headache, dark yellow or brown urine Gallbladder problems--severe stomach pain, nausea, vomiting, fever Kidney injury--decrease in the amount of urine, swelling of the ankles, hands, or feet Pancreatitis--severe stomach pain that spreads to your back or gets worse after eating or when touched, fever, nausea, vomiting Thyroid cancer--new mass or lump in the neck, pain or trouble swallowing, trouble breathing, hoarseness Side effects that usually do not require medical attention (report these to your care team if they continue or are bothersome): Constipation Diarrhea Loss of  Appetite Nausea Stomach pain Upset stomach Vomiting This list may not describe all possible side effects. Call your doctor for medical advice about side effects. You may report side effects to FDA at 1-800-FDA-1088. Where should I keep my medication? Keep out of the reach of children and pets. Refrigeration (preferred): Store unopened pens in a refrigerator between 2 and 8 degrees C (36 and 46 degrees F). Keep it in the original carton until you are ready to take it. Do not freeze or use if the medication has been  frozen. Protect from light. Get rid of any unused medication after the expiration date on the label. Room Temperature: The pen may be stored at room temperature below 30 degrees C (86 degrees F) for up to a total of 21 days if needed. Protect from light. Avoid exposure to extreme heat. If it is stored at room temperature, throw away any unused medication after 21 days or after it expires, whichever is first. The pen has glass parts. Handle it carefully. If you drop the pen on a hard surface, do not use it. Use a new pen for your injection. To get rid of medications that are no longer needed or have expired: Take the medication to a medication take-back program. Check with your pharmacy or law enforcement to find a location. If you cannot return the medication, ask your pharmacist or care team how to get rid of this medication safely. NOTE: This sheet is a summary. It may not cover all possible information. If you have questions about this medicine, talk to your doctor, pharmacist, or health care provider.  2022 Elsevier/Gold Standard (2020-09-23 00:00:00)   Spironolactone Tablets What is this medication? SPIRONOLACTONE (speer on oh LAK tone) treats high blood pressure and heart failure. It may also be used to reduce swelling related to heart, kidney, or liver disease. It helps your kidneys remove more fluid and salt from your blood through the urine without losing too much potassium. It belongs to a group of medications called diuretics. This medicine may be used for other purposes; ask your health care provider or pharmacist if you have questions. COMMON BRAND NAME(S): Aldactone What should I tell my care team before I take this medication? They need to know if you have any of these conditions: Addison's disease or low adrenal gland function High blood level of potassium Kidney disease Liver disease An unusual or allergic reaction to spironolactone, other medications, foods, dyes, or  preservatives Pregnant or trying to get pregnant Breast-feeding How should I use this medication? Take this medication by mouth. Take it as directed on the prescription label at the same time every day. You can take it with or without food. You should always take it the same way. Keep taking it unless your care team tells you to stop. Talk to your care team about the use of this medication in children. Special care may be needed. Overdosage: If you think you have taken too much of this medicine contact a poison control center or emergency room at once. NOTE: This medicine is only for you. Do not share this medicine with others. What if I miss a dose? If you miss a dose, take it as soon as you can. If it is almost time for your next dose, take only that dose. Do not take double or extra doses. What may interact with this medication? Do not take this medication with any of the following: Cidofovir Eplerenone Tranylcypromine This medication may also interact with the following:  Aspirin Certain medications for blood pressure or heart disease like benazepril, lisinopril, losartan, valsartan Certain medications that treat or prevent blood clots like heparin and enoxaparin Cholestyramine Cyclosporine Digoxin Lithium Medications that relax muscles for surgery NSAIDs, medications for pain and inflammation, like ibuprofen or naproxen Other diuretics Potassium salts or supplements Steroid medications like prednisone or cortisone Trimethoprim This list may not describe all possible interactions. Give your health care provider a list of all the medicines, herbs, non-prescription drugs, or dietary supplements you use. Also tell them if you smoke, drink alcohol, or use illegal drugs. Some items may interact with your medicine. What should I watch for while using this medication? Visit your care team for regular checks on your progress. Check your blood pressure as directed. Ask your care team what  your blood pressure should be. Also, find out when you should contact him or her. Do not treat yourself for coughs, colds, or pain while you are using this medication without asking your care team for advice. Some medications may increase your blood pressure. Check with your care team if you have severe diarrhea, nausea, and vomiting, or if you sweat a lot. The loss of too much body fluid may make it dangerous for you to take this medication. You may need to be on a special diet while taking this medication. Ask your care team. Also, find out how many glasses of fluid you need to drink each day. You may get drowsy or dizzy. Do not drive, use machinery, or do anything that needs mental alertness until you know how this medication affects you. Do not stand or sit up quickly, especially if you are an older patient. This reduces the risk of dizzy or fainting spells. Alcohol may interfere with the effects of this medication. Avoid alcoholic drinks. Avoid salt substitutes unless you are told otherwise by your care team. What side effects may I notice from receiving this medication? Side effects that you should report to your care team as soon as possible: Allergic reactions--skin rash, itching, hives, swelling of the face, lips, tongue, or throat Dehydration--increased thirst, dry mouth, feeling faint or lightheaded, headache, dark yellow or brown urine High potassium level--muscle weakness, fast or irregular heartbeat Kidney injury--decrease in the amount of urine, swelling of the ankles, hands, or feet Low blood pressure--dizziness, feeling faint or lightheaded, blurry vision Low sodium level--muscle weakness, fatigue, dizziness, headache, confusion Side effects that usually do not require medical attention (report to your care team if they continue or are bothersome): Breast pain or tenderness Changes in sex drive or performance Dizziness Headache Irregular menstrual cycles or spotting Unexpected  breast tissue growth This list may not describe all possible side effects. Call your doctor for medical advice about side effects. You may report side effects to FDA at 1-800-FDA-1088. Where should I keep my medication? Keep out of the reach of children and pets. Store below 25 degrees C (77 degrees F). Get rid of any unused medication after the expiration date. To get rid of medications that are no longer needed or have expired: Take the medication to a medication take-back program. Check with your pharmacy or law enforcement to find a location. If you cannot return the medication, check the label or package insert to see if the medication should be thrown out in the garbage or flushed down the toilet. If you are not sure, ask your care team. If it is safe to put into the trash, take the medication out of the container. Mix the  medication with cat litter, dirt, coffee grounds, or other unwanted substance. Seal the mixture in a bag or container. Put it in the trash. NOTE: This sheet is a summary. It may not cover all possible information. If you have questions about this medicine, talk to your doctor, pharmacist, or health care provider.  2022 Elsevier/Gold Standard (2021-01-12 00:00:00)

## 2021-05-28 NOTE — Progress Notes (Signed)
Chief Complaint  Patient presents with   Follow-up   6 month f/u  1. Htn elevated on norvasc 10 mg qd stopped taking hctz 25 mg and she is taking klorcon 10 meq h/o hypoK, also on losartan 100 mg qd  2. Stressors son type 1 DM 66 y.o and not doing well healthwise  3. H/o thyroid nodules s/p bx x 2 Dr. Honor Junes Endocrine she will f/u with Dr. Buddy Duty in [facility] and appt scheduled 06/2021 pt did not know informed today  4. H/o bladder suspension surgery and c/o incontinence at times feels like not working disc urogyn referral to consider in [place]    Review of Systems  Constitutional:  Negative for weight loss.  HENT:  Negative for hearing loss.   Eyes:  Negative for blurred vision.  Respiratory:  Negative for shortness of breath.   Cardiovascular:  Negative for chest pain.  Gastrointestinal:  Negative for abdominal pain and blood in stool.  Genitourinary:  Positive for frequency. Negative for dysuria.  Musculoskeletal:  Negative for falls and joint pain.  Skin:  Negative for rash.  Neurological:  Negative for headaches.  Psychiatric/Behavioral:  Negative for depression.        +stress  Past Medical History:  Diagnosis Date   Allergy    Arthritis    hands    Asthma    Colon polyps    Depression    Diabetes mellitus without complication ([place])    Diverticulitis    Fatty liver    History of chicken pox    Hyperlipidemia    Hypertension    Past Surgical History:  Procedure Laterality Date   ABDOMINAL HYSTERECTOMY     DUB ovaries intact s/p bladder suspension/sling no h/o abnormal pap    CATARACT EXTRACTION, BILATERAL     right 03/29/19 and 04/14/19 Left    Family History  Problem Relation Age of Onset   Arthritis Mother    Depression Mother    Diabetes Mother    Heart disease Mother        MI, CHF   Hyperlipidemia Mother    Hypertension Mother    Diabetes Father    Heart disease Father    Stroke Father    Cancer Sister        glioblastoma   Depression Sister    Diabetes  Sister    Hyperlipidemia Sister    Hypertension Sister    Mental illness Sister    Heart disease Maternal Grandmother    Heart disease Maternal Grandfather    Diabetes Son        type 1   Deep vein thrombosis Son        smoker   Social History   Socioeconomic History   Marital status: Divorced    Spouse name: Not on file   Number of children: Not on file   Years of education: Not on file   Highest education level: Not on file  Occupational History   Not on file  Tobacco Use   Smoking status: Former   Smokeless tobacco: Never   Tobacco comments:    quit age 67 y.o duration 12-15 years 1 ppd   Substance and Sexual Activity   Alcohol use: Yes   Drug use: Not Currently   Sexual activity: Not Currently  Other Topics Concern   Not on file  Social History Narrative   Divorced    1 son    1 dog Sadie   Geophysicist/field seismologist, Primary school teacher  Sister lives with her    No guns   Wears seat belt, safe in relationship    Former smoker quit age 12 smoked 12-15 years total 1ppd    Social Determinants of Health   Financial Resource Strain: Not on file  Food Insecurity: Not on file  Transportation Needs: Not on file  Physical Activity: Not on file  Stress: Not on file  Social Connections: Not on file  Intimate Partner Violence: Not on file   Current Meds  Medication Sig   amLODipine (NORVASC) 10 MG tablet TAKE 1 TABLET EVERY DAY   atorvastatin (LIPITOR) 20 MG tablet TAKE 1 TABLET ONE TIME DAILY AT NIGHT   cycloSPORINE (RESTASIS) 0.05 % ophthalmic emulsion 1 drop 2 (two) times daily.   fluticasone furoate-vilanterol (BREO ELLIPTA) 200-25 MCG/ACT AEPB INHALE 1 PUFF INTO THE LUNGS DAILY. RINSE MOUTH WITH USE   Lancets (FREESTYLE) lancets Bid Use as instructed e11.9   losartan (COZAAR) 100 MG tablet TAKE 1 TABLET EVERY DAY   sitaGLIPtin (JANUVIA) 100 MG tablet Take 1 tablet (100 mg total) by mouth daily.   spironolactone (ALDACTONE) 25 MG tablet Take 1 tablet (25  mg total) by mouth daily. In am D/c hctz 25 and potassium   [EXPIRED] Tdap (BOOSTRIX) 5-2.5-18.5 LF-MCG/0.5 injection Inject 0.5 mLs into the muscle once for 1 dose.   [DISCONTINUED] potassium chloride (KLOR-CON) 10 MEQ tablet Take 1 tablet (10 mEq total) by mouth daily.   [DISCONTINUED] sertraline (ZOLOFT) 100 MG tablet Take 1 tablet (100 mg total) by mouth daily.   Allergies  Allergen Reactions   Amoxicillin-Pot Clavulanate Diarrhea   Ciprofloxacin Other (See Comments)    Made her more sick   Metformin Diarrhea   Recent Results (from the past 2160 hour(s))  HM DIABETES EYE EXAM     Status: None   Collection Time: 03/25/21 12:00 AM  Result Value Ref Range   HM Diabetic Eye Exam No Retinopathy No Retinopathy    Comment: Dr. Clovis Fredrickson   Objective  Body mass index is 34.68 kg/m. Wt Readings from Last 3 Encounters:  05/28/21 189 lb 9.6 oz (86 kg)  11/17/20 183 lb 6.4 oz (83.2 kg)  10/29/20 180 lb (81.6 kg)   Temp Readings from Last 3 Encounters:  05/28/21 98.2 F (36.8 C) (Oral)  11/17/20 (!) 97.1 F (36.2 C) (Skin)  10/29/20 98.2 F (36.8 C) (Oral)   BP Readings from Last 3 Encounters:  05/28/21 (!) 142/80  11/17/20 126/68  10/29/20 140/85   Pulse Readings from Last 3 Encounters:  05/28/21 (!) 107  11/17/20 100  10/29/20 72    Physical Exam Vitals and nursing note reviewed.  Constitutional:      Appearance: Normal appearance. She is well-developed and well-groomed.  HENT:     Head: Normocephalic and atraumatic.     Right Ear: There is impacted cerumen.  Eyes:     Conjunctiva/sclera: Conjunctivae normal.     Pupils: Pupils are equal, round, and reactive to light.  Cardiovascular:     Rate and Rhythm: Normal rate and regular rhythm.     Heart sounds: Normal heart sounds. No murmur heard. Pulmonary:     Effort: Pulmonary effort is normal.     Breath sounds: Normal breath sounds.  Abdominal:     General: Abdomen is flat. Bowel sounds are normal.      Tenderness: There is no abdominal tenderness.  Musculoskeletal:        General: No tenderness.  Skin:    General: Skin  is warm and dry.  Neurological:     General: No focal deficit present.     Mental Status: She is alert and oriented to person, place, and time. Mental status is at baseline.     Cranial Nerves: Cranial nerves 2-12 are intact.     Motor: Motor function is intact.     Coordination: Coordination is intact.     Gait: Gait is intact.  Psychiatric:        Attention and Perception: Attention and perception normal.        Mood and Affect: Mood and affect normal.        Speech: Speech normal.        Behavior: Behavior normal. Behavior is cooperative.        Thought Content: Thought content normal.        Cognition and Memory: Cognition and memory normal.        Judgment: Judgment normal.    Assessment  Plan  Hypertension associated with diabetes ([place]) - Plan: spironolactone (ALDACTONE) 25 MG tablet d/c hctz 25 and k 10 meq  Continue losartan 100 mg qd and norvasc 10 mg qd Monitor BP  Multinodular goiter -s/p 2 bx [facility] f/u Dr. Buddy Duty sch 06/2021   Stressors due to sons health he has DM1    HM Hep A/B 3rd injections still due Flu shot utd Prevnar 11/17/20 pna 23 utd Rx today 11/17/20 and 05/28/21 rx Tdap  had shingrix 2/2 moderna 3/3 + 1 pfizer total 4 Declines MMR vx   S/p hysterectomy DUB no h/o abnormal pap no need further pap  mammo neg 02/06/19 ordered 06/04/20 neg ordered   Colonoscopy 07/07/16 tubular adenoma neg path repeat in 5 years consider referral in 02/2021, 11/17/20 10/29/20 CT + diverticulitis with h/o recurrence -referral colonoscopy today   DEXA 07/10/19 normal   Hep C neg 02/24/15   Skin referred [facility] sch next week 05/2021   Rec healthy diet and exercise    Pulm [facility]. Delma Freeze [facility] dentist Former PCP Dr. Arbutus Ped MD 12/21/17 last seen [facility] endocrine [place] 09/08/21 f/u 09/15/21 will  cancel and establish with endocrine in [place] Dr. Buddy Duty sch 07/05/21 H/o MNG s/p bx x 2 thyroid nodules and declines to see [facility] endocrine again   Provider: Dr. Olivia Mackie [facility]

## 2021-06-02 ENCOUNTER — Telehealth: Payer: Self-pay | Admitting: Internal Medicine

## 2021-06-02 DIAGNOSIS — Z658 Other specified problems related to psychosocial circumstances: Secondary | ICD-10-CM | POA: Insufficient documentation

## 2021-06-02 NOTE — Telephone Encounter (Signed)
Did pt want referral to urogynecology in [place] for bladder suspension questions after she had surgery years ago   Still needs 1 more twinrix schedule please

## 2021-06-09 LAB — VITAMIN D 25 HYDROXY (VIT D DEFICIENCY, FRACTURES): Vit D, 25-Hydroxy: 54.1 ng/mL (ref 30.0–100.0)

## 2021-06-09 LAB — COMPREHENSIVE METABOLIC PANEL
ALT: 35 IU/L — ABNORMAL HIGH (ref 0–32)
AST: 20 IU/L (ref 0–40)
Albumin/Globulin Ratio: 1.6 (ref 1.2–2.2)
Albumin: 4.5 g/dL (ref 3.8–4.8)
Alkaline Phosphatase: 83 IU/L (ref 44–121)
BUN/Creatinine Ratio: 14 (ref 12–28)
BUN: 11 mg/dL (ref 8–27)
Bilirubin Total: 0.8 mg/dL (ref 0.0–1.2)
CO2: 24 mmol/L (ref 20–29)
Calcium: 9.7 mg/dL (ref 8.7–10.3)
Chloride: 104 mmol/L (ref 96–106)
Creatinine, Ser: 0.77 mg/dL (ref 0.57–1.00)
Globulin, Total: 2.8 g/dL (ref 1.5–4.5)
Glucose: 214 mg/dL — ABNORMAL HIGH (ref 70–99)
Potassium: 4.5 mmol/L (ref 3.5–5.2)
Sodium: 143 mmol/L (ref 134–144)
Total Protein: 7.3 g/dL (ref 6.0–8.5)
eGFR: 85 mL/min/{1.73_m2} (ref >59)

## 2021-06-09 LAB — CBC WITH DIFFERENTIAL/PLATELET
Basophils Absolute: 0 10*3/uL (ref 0.0–0.2)
Basos: 1 %
EOS (ABSOLUTE): 0.1 10*3/uL (ref 0.0–0.4)
Eos: 1 %
Hematocrit: 43.2 % (ref 34.0–46.6)
Hemoglobin: 14.9 g/dL (ref 11.1–15.9)
Immature Grans (Abs): 0 10*3/uL (ref 0.0–0.1)
Immature Granulocytes: 0 %
Lymphocytes Absolute: 2.4 10*3/uL (ref 0.7–3.1)
Lymphs: 32 %
MCH: 32.1 pg (ref 26.6–33.0)
MCHC: 34.5 g/dL (ref 31.5–35.7)
MCV: 93 fL (ref 79–97)
Monocytes Absolute: 0.5 10*3/uL (ref 0.1–0.9)
Monocytes: 7 %
Neutrophils Absolute: 4.4 10*3/uL (ref 1.4–7.0)
Neutrophils: 59 %
Platelets: 308 10*3/uL (ref 150–450)
RBC: 4.64 x10E6/uL (ref 3.77–5.28)
RDW: 11.6 % — ABNORMAL LOW (ref 11.7–15.4)
WBC: 7.5 10*3/uL (ref 3.4–10.8)

## 2021-06-09 LAB — LIPID PANEL
Chol/HDL Ratio: 2.9 ratio (ref 0.0–4.4)
Cholesterol, Total: 209 mg/dL — ABNORMAL HIGH (ref 100–199)
HDL: 71 mg/dL (ref >39)
LDL Chol Calc (NIH): 115 mg/dL — ABNORMAL HIGH (ref 0–99)
Triglycerides: 133 mg/dL (ref 0–149)
VLDL Cholesterol Cal: 23 mg/dL (ref 5–40)

## 2021-06-09 LAB — TSH: TSH: 2.02 u[IU]/mL (ref 0.450–4.500)

## 2021-06-09 LAB — MICROALBUMIN / CREATININE URINE RATIO
Creatinine, Urine: 82.5 mg/dL
Microalb/Creat Ratio: 10 mg/g creat (ref 0–29)
Microalbumin, Urine: 8.6 ug/mL

## 2021-06-09 LAB — HEMOGLOBIN A1C
Est. average glucose Bld gHb Est-mCnc: 186 mg/dL
Hgb A1c MFr Bld: 8.1 % — ABNORMAL HIGH (ref 4.8–5.6)

## 2021-06-09 NOTE — Telephone Encounter (Signed)
Dr Nicki Reaper- disregard. Resending to Dr Olivia Mackie

## 2021-06-09 NOTE — Telephone Encounter (Signed)
Still needs to schedule twinrix 3rd dose

## 2021-06-09 NOTE — Telephone Encounter (Signed)
Pt aware and scheduled.

## 2021-06-09 NOTE — Addendum Note (Signed)
Addended by: Orland Mustard on: 06/09/2021 02:32 PM   Modules accepted: Orders

## 2021-06-09 NOTE — Telephone Encounter (Signed)
Patient agreeable for referral.

## 2021-06-10 ENCOUNTER — Encounter: Payer: Self-pay | Admitting: Internal Medicine

## 2021-06-10 ENCOUNTER — Other Ambulatory Visit: Payer: Self-pay | Admitting: Internal Medicine

## 2021-06-10 DIAGNOSIS — I152 Hypertension secondary to endocrine disorders: Secondary | ICD-10-CM

## 2021-06-10 DIAGNOSIS — E1159 Type 2 diabetes mellitus with other circulatory complications: Secondary | ICD-10-CM

## 2021-06-10 MED ORDER — TIRZEPATIDE 5 MG/0.5ML ~~LOC~~ SOAJ: 5.0000 mg | SUBCUTANEOUS | 0 refills | Status: DC

## 2021-06-10 MED ORDER — TIRZEPATIDE 2.5 MG/0.5ML ~~LOC~~ SOAJ: 2.5000 mg | SUBCUTANEOUS | 0 refills | Status: DC

## 2021-06-11 ENCOUNTER — Telehealth: Payer: Self-pay | Admitting: Internal Medicine

## 2021-06-11 NOTE — Telephone Encounter (Signed)
Pt called in requesting to speak with you about the phone conversation that she spoke with you about yesterday. Pt requesting callback.

## 2021-06-14 ENCOUNTER — Other Ambulatory Visit: Payer: Self-pay

## 2021-06-14 DIAGNOSIS — I152 Hypertension secondary to endocrine disorders: Secondary | ICD-10-CM

## 2021-06-14 DIAGNOSIS — E1159 Type 2 diabetes mellitus with other circulatory complications: Secondary | ICD-10-CM

## 2021-06-14 MED ORDER — TIRZEPATIDE 5 MG/0.5ML ~~LOC~~ SOAJ: 5.0000 mg | SUBCUTANEOUS | 0 refills | Status: DC

## 2021-06-14 MED ORDER — TIRZEPATIDE 2.5 MG/0.5ML ~~LOC~~ SOAJ: 2.5000 mg | SUBCUTANEOUS | 0 refills | Status: DC

## 2021-06-14 NOTE — Telephone Encounter (Signed)
Pt called in stating she have questions about the medication (tirzepatide [facility]) 2.5 MG/0.5ML Pen) that Dr. Olivia Mackie prescribe her. Pt requesting callback. Pt called in on 06/11/2021 wanting to speak with Gae Bon. Pt stated that her and Gae Bon was talking about a few things.

## 2021-06-14 NOTE — Telephone Encounter (Signed)
Called and spoke with Patient. She states she spoke with her insurance and this medication is covered. Patient states it needs to be sent to [facility] mail order pharmacy.   Medication sent in to preferred pharmacy per protocol.

## 2021-06-17 ENCOUNTER — Ambulatory Visit (INDEPENDENT_AMBULATORY_CARE_PROVIDER_SITE_OTHER): Payer: 59

## 2021-06-17 ENCOUNTER — Other Ambulatory Visit: Payer: Self-pay

## 2021-06-17 DIAGNOSIS — Z23 Encounter for immunization: Secondary | ICD-10-CM

## 2021-06-17 NOTE — Progress Notes (Signed)
Patient came in today for 3rd dose of Twinrix given in left deltoid IM. Patient tolerated well with no signs of distress.

## 2021-06-29 ENCOUNTER — Other Ambulatory Visit: Payer: Self-pay

## 2021-06-29 ENCOUNTER — Ambulatory Visit
Admission: RE | Admit: 2021-06-29 | Discharge: 2021-06-29 | Disposition: A | Discharge status: Home / Self Care | Payer: 59 | Source: Ambulatory Visit | Attending: Internal Medicine | Admitting: Internal Medicine

## 2021-06-29 DIAGNOSIS — Z1231 Encounter for screening mammogram for malignant neoplasm of breast: Secondary | ICD-10-CM | POA: Insufficient documentation

## 2021-08-21 ENCOUNTER — Other Ambulatory Visit: Payer: Self-pay | Admitting: Internal Medicine

## 2021-08-21 DIAGNOSIS — I152 Hypertension secondary to endocrine disorders: Secondary | ICD-10-CM

## 2021-08-27 ENCOUNTER — Telehealth: Payer: Self-pay | Admitting: Internal Medicine

## 2021-08-27 NOTE — Telephone Encounter (Signed)
The patient is on Mounjaro . However, she is switching insurances and she is needing an alternative for [employer]. She will still prefer and injectable. ?

## 2021-08-30 NOTE — Telephone Encounter (Signed)
Left detailed message for pt to reach out to new insurance company to see what is covered preferably Ozempic. And for pt to reach out to [place] once she has an answer.  ?

## 2021-08-30 NOTE — Telephone Encounter (Signed)
Pt will need to call new insurance and see what is preferred ozempic?   ?

## 2021-09-07 ENCOUNTER — Ambulatory Visit: Payer: 59 | Admitting: Obstetrics and Gynecology

## 2021-11-19 ENCOUNTER — Other Ambulatory Visit (HOSPITAL_COMMUNITY)
Admission: RE | Admit: 2021-11-19 | Discharge: 2021-11-19 | Disposition: A | Discharge status: Home / Self Care | Payer: Medicare HMO | Attending: Obstetrics and Gynecology | Admitting: Obstetrics and Gynecology

## 2021-11-19 ENCOUNTER — Ambulatory Visit: Payer: Medicare HMO | Admitting: Obstetrics and Gynecology

## 2021-11-19 ENCOUNTER — Encounter: Payer: Self-pay | Admitting: Obstetrics and Gynecology

## 2021-11-19 VITALS — BP 112/76 | HR 88 | Ht 61.0 in | Wt 166.0 lb

## 2021-11-19 DIAGNOSIS — N3281 Overactive bladder: Secondary | ICD-10-CM | POA: Diagnosis not present

## 2021-11-19 DIAGNOSIS — R82998 Other abnormal findings in urine: Secondary | ICD-10-CM

## 2021-11-19 DIAGNOSIS — N816 Rectocele: Secondary | ICD-10-CM

## 2021-11-19 DIAGNOSIS — R350 Frequency of micturition: Secondary | ICD-10-CM | POA: Diagnosis not present

## 2021-11-19 LAB — POCT URINALYSIS DIPSTICK
Bilirubin, UA: NEGATIVE
Glucose, UA: NEGATIVE
Ketones, UA: NEGATIVE
Nitrite, UA: NEGATIVE
Protein, UA: NEGATIVE
Spec Grav, UA: 1.02 (ref 1.010–1.025)
Urobilinogen, UA: 0.2 E.U./dL
pH, UA: 6 (ref 5.0–8.0)

## 2021-11-19 MED ORDER — TROSPIUM CHLORIDE 20 MG PO TABS: 20.0000 mg | ORAL_TABLET | Freq: Two times a day (BID) | ORAL | 5 refills | Status: AC

## 2021-11-19 NOTE — Progress Notes (Signed)
[facility] New Patient Evaluation and Consultation  Referring Provider: McLean-Scocuzza, Olivia Mackie * PCP: McLean-Scocuzza, Nino Glow, MD Date of Service: 11/19/2021  SUBJECTIVE Chief Complaint: New Patient (Initial Visit) (Tyjai Charbonnet is a 66 y.o. female here for a consult on incomplete emptying. Pt said she has a partial hysterectomy in 2017 and sling and she want to make sure its still good.//)  History of Present Illness: Raima Geathers is a 66 y.o. White or Caucasian female seen in consultation at the request of Dr. Terese Door for evaluation of incontinence.    Review of records from Dr McLean-Scocuzza significant for: Has a history of bladder suspension surgery years ago- complains of incontinence issues.   Urinary Symptoms: Does not leak urine. - very rarely if she holds for too long Had bladder sling in 2006.   Day time voids 6-10.  Nocturia: 3 times per night to void. Voiding dysfunction: she does not empty her bladder well.  does not use a catheter to empty bladder.  When urinating, she feels dribbling after finishing and to push on her belly or vagina to empty bladder. This is new in the last 4-5 months.  Drinks: 1.5 cups coffee in AM, 60oz water per day, occasional glass of wine.  Drinks up until 8pm then goes to sleep around 9-10pm Has been working on blood sugars and they have been more controlled.   UTIs:  0  UTI's in the last year.   Denies history of blood in urine and kidney or bladder stones  Pelvic Organ Prolapse Symptoms:                  She Denies a feeling of a bulge the vaginal area.  Had prolapse repair with her vaginal hysterectomy. Believes mesh was used for prolapse repair but is unsure (surgery done in [place]).   Bowel Symptom: Bowel movements: 1 time(s) per day Stool consistency: hard Straining: yes.  Splinting: yes.  Incomplete evacuation: yes.  She Denies accidental bowel leakage / fecal incontinence Bowel regimen: fiber Last  colonoscopy: Date 09/2016, Results- polyps, diverticulosis  Sexual Function Sexually active: no.   Pelvic Pain Denies pelvic pain    Past Medical History:  Past Medical History:  Diagnosis Date   Allergy    Arthritis    hands    Asthma    Colon polyps    Depression    Diabetes mellitus without complication ([place])    Diverticulitis    Fatty liver    History of chicken pox    Hyperlipidemia    Hypertension      Past Surgical History:   Past Surgical History:  Procedure Laterality Date   CATARACT EXTRACTION, BILATERAL     right 03/29/19 and 04/14/19 Left    VAGINAL HYSTERECTOMY     DUB ovaries intact s/p bladder suspension/sling no h/o abnormal pap    VAGINAL PROLAPSE REPAIR       Past OB/GYN History: OB History  Gravida Para Term Preterm AB Living  '2       1 1  '$ SAB IAB Ectopic Multiple Live Births          1    # Outcome Date GA Lbr Len/2nd Weight Sex Delivery Anes PTL Lv  2 Gravida           1 AB             Forcep delivery. S/p hysterectomy   Medications: She has a current medication list which includes the following prescription(s): amlodipine,  atorvastatin, cyclosporine, fluticasone furoate-vilanterol, freestyle, losartan, mounjaro, sertraline, sitagliptin, spironolactone, tirzepatide, and trospium.   Allergies: Patient is allergic to amoxicillin-pot clavulanate, ciprofloxacin, and metformin.   Social History:  Social History   Tobacco Use   Smoking status: Former   Smokeless tobacco: Never   Tobacco comments:    quit age 58 y.o duration 12-15 years 1 ppd   Substance Use Topics   Alcohol use: Yes    Comment: occationally   Drug use: Never    Relationship status: divorced She lives with sister.   She is employed part time as a Careers information officer. Regular exercise: Yes: walking 2x per day History of abuse: No  Family History:   Family History  Problem Relation Age of Onset   Arthritis Mother    Depression Mother    Diabetes Mother    Heart  disease Mother        MI, CHF   Hyperlipidemia Mother    Hypertension Mother    Diabetes Father    Heart disease Father    Stroke Father    Cancer Sister        glioblastoma   Depression Sister    Diabetes Sister    Hyperlipidemia Sister    Hypertension Sister    Mental illness Sister    Heart disease Maternal Grandmother    Heart disease Maternal Grandfather    Diabetes Son        type 1   Deep vein thrombosis Son        smoker     Review of Systems: Review of Systems  Constitutional:  Positive for weight loss. Negative for fever and malaise/fatigue.  Respiratory:  Negative for cough, shortness of breath and wheezing.   Cardiovascular:  Negative for chest pain, palpitations and leg swelling.  Gastrointestinal:  Negative for abdominal pain and blood in stool.  Genitourinary:  Negative for dysuria.  Musculoskeletal:  Negative for myalgias.  Skin:  Negative for rash.  Neurological:  Positive for dizziness. Negative for headaches.  Endo/Heme/Allergies:  Bruises/bleeds easily.  Psychiatric/Behavioral:  Negative for depression. The patient is not nervous/anxious.      OBJECTIVE Physical Exam: Vitals:   11/19/21 1427  BP: 112/76  Pulse: 88  Weight: 166 lb (75.3 kg)  Height: '5\' 1"'$  (1.549 m)    Physical Exam Constitutional:      General: She is not in acute distress. Pulmonary:     Effort: Pulmonary effort is normal.  Abdominal:     General: There is no distension.     Palpations: Abdomen is soft.     Tenderness: There is no abdominal tenderness. There is no rebound.  Musculoskeletal:        General: No swelling. Normal range of motion.  Skin:    General: Skin is warm and dry.     Findings: No rash.  Neurological:     Mental Status: She is alert and oriented to person, place, and time.  Psychiatric:        Mood and Affect: Mood normal.        Behavior: Behavior normal.      GU / Detailed Urogynecologic Evaluation:  Pelvic Exam: Normal external female  genitalia; Bartholin's and Skene's glands normal in appearance; urethral meatus normal in appearance, no urethral masses or discharge.   CST: negative  s/p hysterectomy: Speculum exam reveals normal vaginal mucosa with  atrophy and normal vaginal cuff.  Adnexa no mass, fullness, tenderness.  No evidence of mesh erosion/ exposure  Pelvic floor  strength II/V  Pelvic floor musculature: Right levator non-tender, Right obturator non-tender, Left levator non-tender, Left obturator non-tender  POP-Q:   POP-Q  -2                                            Aa   -2                                           Ba  -5.5                                              C   3.5                                            Gh  4.5                                            Pb  6.5                                            tvl   0                                            Ap  0                                            Bp                                                 D     Rectal Exam:  Normal external rectum  Post-Void Residual (PVR) by Bladder Scan: In order to evaluate bladder emptying, we discussed obtaining a postvoid residual and she agreed to this procedure.  Procedure: The ultrasound unit was placed on the patient's abdomen in the suprapubic region after the patient had voided. A PVR of 17 ml was obtained by bladder scan.  Laboratory Results: POC urine: moderate leukocytes, trace blood   ASSESSMENT AND PLAN Ms. Oliva is a 66 y.o. with:  1. Overactive bladder   2. Urinary frequency   3. Leukocytes in urine   4. Prolapse of posterior vaginal wall    OAB - We discussed the symptoms of overactive bladder (OAB), which include urinary urgency, urinary frequency, nocturia, with or without urge incontinence.  While we do not know the exact etiology of OAB, several treatment options exist. We discussed management including behavioral therapy (decreasing  bladder irritants, urge  suppression strategies, timed voids, bladder retraining), physical therapy, medication; for refractory cases posterior tibial nerve stimulation, sacral neuromodulation, and intravesical botulinum toxin injection. - Will have her fill out 3 day bladder diary to assess nocturia further. - Prescribed trospium '20mg'$  BID. Advised to not start until completing bladder diary.  For anticholinergic medications, we discussed the potential side effects of anticholinergics including dry eyes, dry mouth, constipation, cognitive impairment and urinary retention. - Since symptoms have occurred more recently, want to rule out possible mesh erosion from sling. Will plan for cystoscopy in the office.   2. Leukocytes in urine - culture sent  3. Stage I anterior, Stage II posterior, Stage I apical prolapse - She is asymptomatic from her prolapse and wants to expectantly manage. Discussed avoiding heavy lifting or straining to prevent progression.   Return for cystoscopy   Jaquita Folds, MD

## 2021-11-19 NOTE — Patient Instructions (Signed)

## 2021-11-20 LAB — URINE CULTURE: Culture: NO GROWTH

## 2021-11-25 ENCOUNTER — Telehealth: Payer: Self-pay

## 2021-11-25 ENCOUNTER — Encounter: Payer: Self-pay | Admitting: Internal Medicine

## 2021-11-25 ENCOUNTER — Ambulatory Visit (INDEPENDENT_AMBULATORY_CARE_PROVIDER_SITE_OTHER): Payer: Medicare HMO | Admitting: Internal Medicine

## 2021-11-25 VITALS — BP 126/78 | HR 86 | Temp 97.8°F | Ht 61.0 in | Wt 166.0 lb

## 2021-11-25 DIAGNOSIS — E1165 Type 2 diabetes mellitus with hyperglycemia: Secondary | ICD-10-CM | POA: Diagnosis not present

## 2021-11-25 DIAGNOSIS — G8929 Other chronic pain: Secondary | ICD-10-CM

## 2021-11-25 DIAGNOSIS — E611 Iron deficiency: Secondary | ICD-10-CM

## 2021-11-25 DIAGNOSIS — M545 Low back pain, unspecified: Secondary | ICD-10-CM

## 2021-11-25 DIAGNOSIS — Z1231 Encounter for screening mammogram for malignant neoplasm of breast: Secondary | ICD-10-CM

## 2021-11-25 DIAGNOSIS — H6123 Impacted cerumen, bilateral: Secondary | ICD-10-CM

## 2021-11-25 DIAGNOSIS — I10 Essential (primary) hypertension: Secondary | ICD-10-CM | POA: Diagnosis not present

## 2021-11-25 DIAGNOSIS — Z6831 Body mass index (BMI) 31.0-31.9, adult: Secondary | ICD-10-CM

## 2021-11-25 DIAGNOSIS — R42 Dizziness and giddiness: Secondary | ICD-10-CM | POA: Diagnosis not present

## 2021-11-25 DIAGNOSIS — E1159 Type 2 diabetes mellitus with other circulatory complications: Secondary | ICD-10-CM | POA: Diagnosis not present

## 2021-11-25 DIAGNOSIS — I152 Hypertension secondary to endocrine disorders: Secondary | ICD-10-CM | POA: Diagnosis not present

## 2021-11-25 DIAGNOSIS — M25551 Pain in right hip: Secondary | ICD-10-CM | POA: Diagnosis not present

## 2021-11-25 DIAGNOSIS — H9203 Otalgia, bilateral: Secondary | ICD-10-CM | POA: Diagnosis not present

## 2021-11-25 DIAGNOSIS — E785 Hyperlipidemia, unspecified: Secondary | ICD-10-CM

## 2021-11-25 DIAGNOSIS — R7989 Other specified abnormal findings of blood chemistry: Secondary | ICD-10-CM

## 2021-11-25 LAB — COMPREHENSIVE METABOLIC PANEL
ALT: 23 U/L (ref 0–35)
AST: 17 U/L (ref 0–37)
Albumin: 4.7 g/dL (ref 3.5–5.2)
Alkaline Phosphatase: 67 U/L (ref 39–117)
BUN: 16 mg/dL (ref 6–23)
CO2: 26 mEq/L (ref 19–32)
Calcium: 10.1 mg/dL (ref 8.4–10.5)
Chloride: 101 mEq/L (ref 96–112)
Creatinine, Ser: 0.77 mg/dL (ref 0.40–1.20)
GFR: 79.85 mL/min (ref >60.00)
Glucose, Bld: 103 mg/dL — ABNORMAL HIGH (ref 70–99)
Potassium: 4.5 mEq/L (ref 3.5–5.1)
Sodium: 138 mEq/L (ref 135–145)
Total Bilirubin: 1.2 mg/dL (ref 0.2–1.2)
Total Protein: 7.6 g/dL (ref 6.0–8.3)

## 2021-11-25 LAB — CBC WITH DIFFERENTIAL/PLATELET
Basophils Absolute: 0 10*3/uL (ref 0.0–0.1)
Basophils Relative: 0.5 % (ref 0.0–3.0)
Eosinophils Absolute: 0.1 10*3/uL (ref 0.0–0.7)
Eosinophils Relative: 1.2 % (ref 0.0–5.0)
HCT: 44.9 % (ref 36.0–46.0)
Hemoglobin: 15.3 g/dL — ABNORMAL HIGH (ref 12.0–15.0)
Lymphocytes Relative: 17.8 % (ref 12.0–46.0)
Lymphs Abs: 1.6 10*3/uL (ref 0.7–4.0)
MCHC: 34 g/dL (ref 30.0–36.0)
MCV: 93.8 fl (ref 78.0–100.0)
Monocytes Absolute: 0.5 10*3/uL (ref 0.1–1.0)
Monocytes Relative: 5.6 % (ref 3.0–12.0)
Neutro Abs: 6.6 10*3/uL (ref 1.4–7.7)
Neutrophils Relative %: 74.9 % (ref 43.0–77.0)
Platelets: 332 10*3/uL (ref 150.0–400.0)
RBC: 4.79 Mil/uL (ref 3.87–5.11)
RDW: 12.9 % (ref 11.5–15.5)
WBC: 8.8 10*3/uL (ref 4.0–10.5)

## 2021-11-25 LAB — LIPID PANEL
Cholesterol: 166 mg/dL (ref 0–200)
HDL: 54.4 mg/dL (ref >39.00)
LDL Cholesterol: 85 mg/dL (ref 0–99)
NonHDL: 111.93
Total CHOL/HDL Ratio: 3
Triglycerides: 133 mg/dL (ref 0.0–149.0)
VLDL: 26.6 mg/dL (ref 0.0–40.0)

## 2021-11-25 LAB — IBC + FERRITIN
Ferritin: 113.4 ng/mL (ref 10.0–291.0)
Iron: 96 ug/dL (ref 42–145)
Saturation Ratios: 23.3 % (ref 20.0–50.0)
TIBC: 411.6 ug/dL (ref 250.0–450.0)
Transferrin: 294 mg/dL (ref 212.0–360.0)

## 2021-11-25 LAB — HEMOGLOBIN A1C: Hgb A1c MFr Bld: 6.4 % (ref 4.6–6.5)

## 2021-11-25 MED ORDER — LOSARTAN POTASSIUM 100 MG PO TABS: 100.0000 mg | ORAL_TABLET | Freq: Every day | ORAL | 3 refills | Status: AC

## 2021-11-25 MED ORDER — AMLODIPINE BESYLATE 10 MG PO TABS: 10.0000 mg | ORAL_TABLET | Freq: Every day | ORAL | 3 refills | Status: AC

## 2021-11-25 MED ORDER — ATORVASTATIN CALCIUM 20 MG PO TABS: ORAL_TABLET | ORAL | 3 refills | Status: AC

## 2021-11-25 MED ORDER — SERTRALINE HCL 100 MG PO TABS: 100.0000 mg | ORAL_TABLET | Freq: Every day | ORAL | 3 refills | Status: AC

## 2021-11-25 MED ORDER — SPIRONOLACTONE 25 MG PO TABS: 25.0000 mg | ORAL_TABLET | Freq: Every day | ORAL | 3 refills | Status: AC

## 2021-11-25 MED ORDER — MOUNJARO 5 MG/0.5ML ~~LOC~~ SOAJ
5.0000 mg | SUBCUTANEOUS | 6 refills | Status: DC
Start: 2021-11-25 — End: 2021-12-01

## 2021-11-25 NOTE — Telephone Encounter (Signed)
Lvm for pt to return call in regards to lab results.  Per Dr.Tracy: Liver kidneys normal  Hemoglobin slightly elevated will monitor for now  Cholesterol normal  A1c improved 6.4 continue mounjaro 5 mg weekly if approved  Iron normal

## 2021-11-25 NOTE — Patient Instructions (Addendum)
Call and make appt Dr. Buddy Duty reschedule thyroid US if needed and disc dm and dexcom 7  Title Resident? Provider type  MD No Physician   Primary Contact Information  Phone Fax E-mail Address  [phone] Not available [address]   [address]   [place] [address]     Specialties     Endocrinology      2. ENT Dr. Kathyrn Sheriff   Phone Fax E-mail Address  [phone] Not available [address]   [place] [address]     Specialties     Otolaryngology      3. Dr. Blenda Peals ortho MD Physician   Primary Contact Information  Phone Fax E-mail Address  [phone] Not available [place] [address]     Specialties     Orthopedic Surgery

## 2021-11-25 NOTE — Progress Notes (Signed)
Chief Complaint  Patient presents with   Follow-up    6 month f/u with concerns of lower back pain and hip pain. Pt would also like a referral to ENT for the wax in her ears making her dizzy at times.   6 month f/u  1. Dm 2 last A1c 8.1 on mounjaro 5 mg weekly wants dexcom 7 cbgs in the am better per pt  2. Htn controlled lipitor 20 mg qhs spironolactone 25 mg qd and losartan 100 mg qd, norvasc 10 mg qd 3. Increase wax referral to ent  4. Low back pain and right hip pain h/o shot in the past which helped 3/10 pain nothing tried has voltaren gel at home for hand arthritis     Review of Systems  Constitutional:  Negative for weight loss.  HENT:  Negative for hearing loss.   Eyes:  Negative for blurred vision.  Respiratory:  Negative for shortness of breath.   Cardiovascular:  Negative for chest pain.  Gastrointestinal:  Negative for abdominal pain and blood in stool.  Genitourinary:  Negative for dysuria.  Musculoskeletal:  Positive for back pain and joint pain. Negative for falls.  Skin:  Negative for rash.  Neurological:  Negative for headaches.  Psychiatric/Behavioral:  Negative for depression.    Past Medical History:  Diagnosis Date   Allergy    Arthritis    hands    Asthma    Colon polyps    Depression    Diabetes mellitus without complication ([place])    Diverticulitis    Fatty liver    History of chicken pox    Hyperlipidemia    Hypertension    Past Surgical History:  Procedure Laterality Date   CATARACT EXTRACTION, BILATERAL     right 03/29/19 and 04/14/19 Left    VAGINAL HYSTERECTOMY     DUB ovaries intact s/p bladder suspension/sling no h/o abnormal pap    VAGINAL PROLAPSE REPAIR     Family History  Problem Relation Age of Onset   Arthritis Mother    Depression Mother    Diabetes Mother    Heart disease Mother        MI, CHF   Hyperlipidemia Mother    Hypertension Mother    Diabetes Father    Heart disease Father    Stroke Father    Cancer Sister         glioblastoma   Depression Sister    Diabetes Sister    Hyperlipidemia Sister    Hypertension Sister    Mental illness Sister    Heart disease Maternal Grandmother    Heart disease Maternal Grandfather    Diabetes Son        type 1   Deep vein thrombosis Son        smoker   Social History   Socioeconomic History   Marital status: Divorced    Spouse name: Not on file   Number of children: Not on file   Years of education: Not on file   Highest education level: Not on file  Occupational History   Not on file  Tobacco Use   Smoking status: Former   Smokeless tobacco: Never   Tobacco comments:    quit age 89 y.o duration 12-15 years 1 ppd   Vaping Use   Vaping Use: Not on file  Substance and Sexual Activity   Alcohol use: Yes    Comment: occationally   Drug use: Never   Sexual activity: Not Currently  Other  Topics Concern   Not on file  Social History Narrative   Divorced    1 son    1 dog Advertising account executive, Primary school teacher    Sister lives with her    No guns   Wears seat belt, safe in relationship    Former smoker quit age 89 smoked 12-15 years total 1ppd    Social Determinants of Health   Financial Resource Strain: Not on file  Food Insecurity: Not on file  Transportation Needs: Not on file  Physical Activity: Not on file  Stress: Not on file  Social Connections: Not on file  Intimate Partner Violence: Not on file   Current Meds  Medication Sig   cycloSPORINE (RESTASIS) 0.05 % ophthalmic emulsion 1 drop 2 (two) times daily.   fluticasone furoate-vilanterol (BREO ELLIPTA) 200-25 MCG/ACT AEPB INHALE 1 PUFF INTO THE LUNGS DAILY. RINSE MOUTH WITH USE   Lancets (FREESTYLE) lancets Bid Use as instructed e11.9   tirzepatide (MOUNJARO) 5 MG/0.5ML Pen Inject 5 mg into the skin once a week.   trospium (SANCTURA) 20 MG tablet Take 1 tablet (20 mg total) by mouth 2 (two) times daily.   [DISCONTINUED] amLODipine (NORVASC) 10 MG tablet TAKE 1  TABLET EVERY DAY   [DISCONTINUED] atorvastatin (LIPITOR) 20 MG tablet TAKE 1 TABLET ONE TIME DAILY AT NIGHT   [DISCONTINUED] losartan (COZAAR) 100 MG tablet TAKE 1 TABLET EVERY DAY   [DISCONTINUED] MOUNJARO 5 MG/0.5ML Pen INJECT 5MG (1 PEN) UNDER THE SKIN EVERY WEEK  (FOR MONTH 2 AS DIRECTED)   [DISCONTINUED] sertraline (ZOLOFT) 100 MG tablet Take 1 tablet (100 mg total) by mouth daily.   [DISCONTINUED] spironolactone (ALDACTONE) 25 MG tablet Take 1 tablet (25 mg total) by mouth daily. In am D/c hctz 25 and potassium   Allergies  Allergen Reactions   Amoxicillin-Pot Clavulanate Diarrhea   Ciprofloxacin Other (See Comments)    Made her more sick   Metformin Diarrhea   Recent Results (from the past 2160 hour(s))  Urine Culture     Status: None   Collection Time: 11/19/21  2:45 PM   Specimen: Urine, Random  Result Value Ref Range   Specimen Description URINE, RANDOM    Special Requests NONE    Culture      NO GROWTH Performed at [facility], [address] [place], [place] [address]    Report Status 11/20/2021 FINAL   POCT Urinalysis Dipstick     Status: Abnormal   Collection Time: 11/19/21  3:55 PM  Result Value Ref Range   Color, UA Yellow    Clarity, UA Clear    Glucose, UA Negative Negative   Bilirubin, UA Negative    Ketones, UA Negative    Spec Grav, UA 1.020 1.010 - 1.025   Blood, UA Trace-Intact    pH, UA 6.0 5.0 - 8.0   Protein, UA Negative Negative   Urobilinogen, UA 0.2 0.2 or 1.0 E.U./dL   Nitrite, UA Negative    Leukocytes, UA Moderate (2+) (A) Negative   Appearance     Odor     Objective  Body mass index is 31.37 kg/m. Wt Readings from Last 3 Encounters:  11/25/21 166 lb (75.3 kg)  11/19/21 166 lb (75.3 kg)  05/28/21 189 lb 9.6 oz (86 kg)   Temp Readings from Last 3 Encounters:  11/25/21 97.8 F (36.6 C) (Oral)  05/28/21 98.2 F (36.8 C) (Oral)  11/17/20 (!) 97.1 F (36.2 C) (Skin)  BP Readings from Last 3 Encounters:  11/25/21 126/78   11/19/21 112/76  05/28/21 (!) 142/80   Pulse Readings from Last 3 Encounters:  11/25/21 86  11/19/21 88  05/28/21 (!) 107    Physical Exam Vitals and nursing note reviewed.  Constitutional:      Appearance: Normal appearance. She is well-developed and well-groomed.  HENT:     Head: Normocephalic and atraumatic.  Eyes:     Conjunctiva/sclera: Conjunctivae normal.     Pupils: Pupils are equal, round, and reactive to light.  Cardiovascular:     Rate and Rhythm: Normal rate and regular rhythm.     Heart sounds: Normal heart sounds. No murmur heard. Pulmonary:     Effort: Pulmonary effort is normal.     Breath sounds: Normal breath sounds.  Abdominal:     General: Abdomen is flat. Bowel sounds are normal.     Tenderness: There is no abdominal tenderness.  Musculoskeletal:        General: No tenderness.  Skin:    General: Skin is warm and dry.  Neurological:     General: No focal deficit present.     Mental Status: She is alert and oriented to person, place, and time. Mental status is at baseline.     Cranial Nerves: Cranial nerves 2-12 are intact.     Motor: Motor function is intact.     Coordination: Coordination is intact.     Gait: Gait is intact.  Psychiatric:        Attention and Perception: Attention and perception normal.        Mood and Affect: Mood and affect normal.        Speech: Speech normal.        Behavior: Behavior normal. Behavior is cooperative.        Thought Content: Thought content normal.        Cognition and Memory: Cognition and memory normal.        Judgment: Judgment normal.     Assessment  Plan  Hypertension associated with diabetes ([place]) - Plan: Comprehensive metabolic panel, Lipid panel, CBC with Differential/Platelet, Hemoglobin A1c, tirzepatide (MOUNJARO) 5 MG/0.5ML Pen, spironolactone (ALDACTONE) 25 MG tablet Losartan 100 mg qd  Norvasc 10 mg qd  Bilateral impacted cerumen - Plan: Ambulatory referral to ENT Otalgia of both ears -  Plan: Ambulatory referral to ENT Dizziness - Plan: Ambulatory referral to ENT  Right hip pain - Plan: Ambulatory referral to Orthopedic Surgery Chronic midline low back pain, unspecified whether sciatica present - Plan: Ambulatory referral to Orthopedic Surgery  Elevated liver function tests sl slt 35 05/2021 pt stopped wine qd and does osccastionally now  Iron deficiency - Plan: IBC + Ferritin  BMI 31.0-31.9,adult - Plan: tirzepatide (MOUNJARO) 5 MG/0.5ML Pen  Hyperlipidemia, unspecified hyperlipidemia type - Plan: tirzepatide (MOUNJARO) 5 MG/0.5ML Pen, atorvastatin (LIPITOR) 20 MG tablet    HM Hep A/B 3rd injections still due Flu shot utd Prevnar 11/17/20 pna 23 utd Tdap 08/06/21 had shingrix 2/2 moderna 3/3 + 1 pfizer total 4 Declines MMR vx   S/p hysterectomy DUB no h/o abnormal pap no need further pap   mammo neg 06/29/21 ordered 2024    Colonoscopy 07/07/16 tubular adenoma neg path repeat in 5 years consider referral in 02/2021, 11/17/20 10/29/20 CT + diverticulitis with h/o recurrence -referral colonoscopy appt end of 12/2021   DEXA 07/10/19 normal    Hep C neg 02/24/15    Skin referred [place] [facility] sch next week 05/2021  Cystoscopy 01/2022   Rec healthy diet and exercise    Pulm [facility] Delma Free[facility] dentist Former PCP Dr. Arbutus Ped MD 12/21/17 last seen [facility] endocrine [place] 09/08/21 f/u 09/15/21 will cancel and establish with endocrine in [place] Dr. 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 Olivia Mackie McLean-Scocuzza-Internal Medicine

## 2021-11-26 NOTE — Telephone Encounter (Signed)
Called pt x3 lvm x3 for pt to return call in regards to lab results. Mailing letter w/results for pt to reach out with any questions.  Per Dr.Tracy: Liver kidneys normal  Hemoglobin slightly elevated will monitor for now  Cholesterol normal  A1c improved 6.4 continue mounjaro 5 mg weekly if approved Iron normal

## 2021-11-26 NOTE — Telephone Encounter (Signed)
Called pt x2 lvm x2 for pt to return call in regards to lab results.  Per Dr.Tracy: Liver kidneys normal  Hemoglobin slightly elevated will monitor for now  Cholesterol normal  A1c improved 6.4 continue mounjaro 5 mg weekly if approved  Iron normal

## 2021-11-29 NOTE — Telephone Encounter (Signed)
Patient returned called advised of below note; Patient understood.

## 2021-12-01 ENCOUNTER — Other Ambulatory Visit: Payer: Self-pay | Admitting: Internal Medicine

## 2021-12-01 ENCOUNTER — Telehealth: Payer: Self-pay | Admitting: Internal Medicine

## 2021-12-01 DIAGNOSIS — H6123 Impacted cerumen, bilateral: Secondary | ICD-10-CM | POA: Diagnosis not present

## 2021-12-01 DIAGNOSIS — E1159 Type 2 diabetes mellitus with other circulatory complications: Secondary | ICD-10-CM

## 2021-12-01 DIAGNOSIS — R42 Dizziness and giddiness: Secondary | ICD-10-CM | POA: Diagnosis not present

## 2021-12-01 MED ORDER — SEMAGLUTIDE (1 MG/DOSE) 4 MG/3ML ~~LOC~~ SOPN: 1.0000 mg | PEN_INJECTOR | SUBCUTANEOUS | 1 refills | Status: DC

## 2021-12-01 MED ORDER — SEMAGLUTIDE (2 MG/DOSE) 8 MG/3ML ~~LOC~~ SOPN: 2.0000 mg | PEN_INJECTOR | SUBCUTANEOUS | 5 refills | Status: AC

## 2021-12-01 MED ORDER — SEMAGLUTIDE(0.25 OR 0.5MG/DOS) 2 MG/3ML ~~LOC~~ SOPN: PEN_INJECTOR | SUBCUTANEOUS | 0 refills | Status: DC

## 2021-12-01 NOTE — Telephone Encounter (Signed)
Lvm to inform pt in regards to Dr.Tracy's note: Inform insurance will not cover mounjaro 5 mg weekly had to change to ozempic 0.25 weekly x 1 month then 0.5 then 1 mg then 2 mg if tolerated all weekly x 1 month [facility]   We have not received PA request as of yet but once we do, we will start & wait to see if insurance approves or denies.

## 2021-12-01 NOTE — Addendum Note (Signed)
Addended by: Orland Mustard on: 12/01/2021 12:25 PM   Modules accepted: Orders

## 2021-12-01 NOTE — Telephone Encounter (Signed)
Inform insurance will not cover mounjaro 5 mg weekly had to change to ozempic 0.25 weekly x 1 month then 0.5 then 1 mg then 2 mg if tolerated all weekly x 1 month [facility]

## 2021-12-28 ENCOUNTER — Telehealth: Payer: Self-pay

## 2021-12-28 NOTE — Telephone Encounter (Signed)
Patient states she has changed her insurance to [facility] and they do not have her fluticasone furoate-vilanterol (BREO ELLIPTA) 200-25 MCG/ACT AEPB on file, so she would like for [place] to send them a prescription.  Patient states we have the contact information for [facility].  *Patient states her preferred pharmacy for this medication is [facility].

## 2021-12-29 ENCOUNTER — Telehealth: Payer: Self-pay

## 2021-12-29 ENCOUNTER — Other Ambulatory Visit: Payer: Self-pay

## 2021-12-29 DIAGNOSIS — J45909 Unspecified asthma, uncomplicated: Secondary | ICD-10-CM

## 2021-12-29 MED ORDER — FLUTICASONE FUROATE-VILANTEROL 200-25 MCG/ACT IN AEPB: INHALATION_SPRAY | RESPIRATORY_TRACT | 3 refills | Status: AC

## 2021-12-29 NOTE — Telephone Encounter (Signed)
Spoke with pts sister to have her give me a call back. Sister said she will call her and have her call us back.

## 2021-12-29 NOTE — Telephone Encounter (Signed)
LMOM for pt to CB in regards to her first call.   I spoke with pts insurance I also sent the medication into her preferred pharmacy.  Ref person on [employer] call : Alycia Patten said it may take 24-48 hours for the said medication to show up on their side.  I asked was said med covered he said it would with a co pay of $311.19 for 90 day supply of medication

## 2021-12-29 NOTE — Telephone Encounter (Signed)
Spoke with pt and pts insurance this has been resolved pt was notified of her copay for medication $311.19

## 2021-12-29 NOTE — Telephone Encounter (Signed)
Pt notified about her co pay for her medication:  December 29, 2021 Me      12/29/21 12:02 PM Note LMOM for pt to CB in regards to her first call.     I spoke with pts insurance I also sent the medication into her preferred pharmacy.   Ref person on [employer] call : Alycia Patten said it may take 24-48 hours for the said medication to show up on their side.   I asked was said med covered he said it would with a co pay of $311.19 for 90 day supply of medication         Cyndi Lennert      12/28/21 11:20 AM Note Patient states she has changed her insurance to [facility] and they do not have her fluticasone furoate-vilanterol (BREO ELLIPTA) 200-25 MCG/ACT AEPB on file, so she would like for [place] to send them a prescription.  Patient states we have the contact information for [facility].   *Patient states her preferred pharmacy for this medication [facility].

## 2022-01-05 ENCOUNTER — Telehealth: Payer: Self-pay | Admitting: Internal Medicine

## 2022-01-05 DIAGNOSIS — Z86010 Personal history of colonic polyps: Secondary | ICD-10-CM | POA: Diagnosis not present

## 2022-01-05 NOTE — Telephone Encounter (Signed)
Copied from [place] [id number]. Topic: Medicare AWV >> Jan 05, 2022 10:11 AM Devoria Glassing wrote: Reason for CRM: Left message for patient to schedule Annual Wellness Visit.  Please schedule with Nurse Health Advisor Denisa O'Brien-Blaney, LPN at [facility]. This appt can be telephone or office visit.  Please call [phone] ask for [facility]

## 2022-01-11 ENCOUNTER — Telehealth: Payer: Self-pay | Admitting: Internal Medicine

## 2022-01-11 NOTE — Telephone Encounter (Signed)
Copied from [place] [phone]. Topic: Medicare AWV >> Jan 11, 2022 11:24 AM Devoria Glassing wrote: Reason for CRM: Left message for patient to schedule Annual Wellness Visit.  Please schedule with Nurse Health Advisor Denisa O'Brien-Blaney, LPN at [facility]. This appt can be telephone or office visit.  Please call [phone] ask for [facility]

## 2022-01-17 ENCOUNTER — Telehealth: Payer: Self-pay | Admitting: Internal Medicine

## 2022-01-17 NOTE — Telephone Encounter (Signed)
Copied from [place] [phone]. Topic: Medicare AWV >> Jan 17, 2022  1:44 PM Devoria Glassing wrote: Reason for CRM: Left message for patient to schedule Annual Wellness Visit.  Please schedule with Nurse Health Advisor Denisa O'Brien-Blaney, LPN at [facility]. This appt can be telephone or office visit.  Please call [phone] ask for [facility]

## 2022-01-27 ENCOUNTER — Other Ambulatory Visit: Payer: 59 | Admitting: Obstetrics and Gynecology

## 2022-01-27 DIAGNOSIS — K640 First degree hemorrhoids: Secondary | ICD-10-CM | POA: Diagnosis not present

## 2022-01-27 DIAGNOSIS — K5730 Diverticulosis of large intestine without perforation or abscess without bleeding: Secondary | ICD-10-CM | POA: Diagnosis not present

## 2022-01-27 DIAGNOSIS — D122 Benign neoplasm of ascending colon: Secondary | ICD-10-CM | POA: Diagnosis not present

## 2022-01-27 DIAGNOSIS — Z86010 Personal history of colonic polyps: Secondary | ICD-10-CM | POA: Diagnosis not present

## 2022-01-27 DIAGNOSIS — D123 Benign neoplasm of transverse colon: Secondary | ICD-10-CM | POA: Diagnosis not present

## 2022-01-27 DIAGNOSIS — Z1211 Encounter for screening for malignant neoplasm of colon: Secondary | ICD-10-CM | POA: Diagnosis not present

## 2022-01-27 DIAGNOSIS — D126 Benign neoplasm of colon, unspecified: Secondary | ICD-10-CM | POA: Diagnosis not present

## 2022-02-07 DIAGNOSIS — H04123 Dry eye syndrome of bilateral lacrimal glands: Secondary | ICD-10-CM | POA: Diagnosis not present

## 2022-02-07 DIAGNOSIS — H524 Presbyopia: Secondary | ICD-10-CM | POA: Diagnosis not present

## 2022-02-07 DIAGNOSIS — Z7984 Long term (current) use of oral hypoglycemic drugs: Secondary | ICD-10-CM | POA: Diagnosis not present

## 2022-02-07 DIAGNOSIS — E119 Type 2 diabetes mellitus without complications: Secondary | ICD-10-CM | POA: Diagnosis not present

## 2022-02-07 DIAGNOSIS — Z961 Presence of intraocular lens: Secondary | ICD-10-CM | POA: Diagnosis not present

## 2022-02-17 ENCOUNTER — Encounter: Payer: Self-pay | Admitting: Internal Medicine

## 2022-02-17 LAB — HM DIABETES EYE EXAM

## 2022-03-01 ENCOUNTER — Telehealth: Payer: Self-pay | Admitting: Internal Medicine

## 2022-03-01 NOTE — Telephone Encounter (Signed)
Copied from [place] [phone]. Topic: Medicare AWV >> Mar 01, 2022 11:47 AM Devoria Glassing wrote: Reason for CRM: Left message for patient to schedule Annual Wellness Visit.  Please schedule with Nurse Health Advisor Denisa O'Brien-Blaney, LPN at [facility]. This appt can be telephone or office visit.  Please call [phone] ask for [facility]

## 2022-03-07 DIAGNOSIS — H01025 Squamous blepharitis left lower eyelid: Secondary | ICD-10-CM | POA: Diagnosis not present

## 2022-03-07 DIAGNOSIS — H01022 Squamous blepharitis right lower eyelid: Secondary | ICD-10-CM | POA: Diagnosis not present

## 2022-03-07 DIAGNOSIS — Z7984 Long term (current) use of oral hypoglycemic drugs: Secondary | ICD-10-CM | POA: Diagnosis not present

## 2022-03-07 DIAGNOSIS — E119 Type 2 diabetes mellitus without complications: Secondary | ICD-10-CM | POA: Diagnosis not present

## 2022-03-07 DIAGNOSIS — H04123 Dry eye syndrome of bilateral lacrimal glands: Secondary | ICD-10-CM | POA: Diagnosis not present

## 2022-03-10 ENCOUNTER — Other Ambulatory Visit: Payer: 59 | Admitting: Obstetrics and Gynecology

## 2022-03-16 ENCOUNTER — Other Ambulatory Visit: Payer: Self-pay | Admitting: Internal Medicine

## 2022-03-16 DIAGNOSIS — E042 Nontoxic multinodular goiter: Secondary | ICD-10-CM

## 2022-03-22 ENCOUNTER — Ambulatory Visit
Admission: RE | Admit: 2022-03-22 | Discharge: 2022-03-22 | Disposition: A | Discharge status: Home / Self Care | Payer: Medicare HMO | Source: Ambulatory Visit | Attending: Internal Medicine | Admitting: Internal Medicine

## 2022-03-22 DIAGNOSIS — E041 Nontoxic single thyroid nodule: Secondary | ICD-10-CM | POA: Diagnosis not present

## 2022-03-22 DIAGNOSIS — E042 Nontoxic multinodular goiter: Secondary | ICD-10-CM

## 2022-03-23 NOTE — Progress Notes (Signed)
[facility] Return Visit  SUBJECTIVE  History of Present Illness: Jessica Snyder is a 66 y.o. female seen in follow-up for overactive bladder. Has a history of midurethral sling in 2006. Last visit she was started on Trospium '20mg'$  BID.   She reports she did not end up taking the tropsium because she was worried about having to take another pill. She stopped drinking after 7pm and that has been helping her wake up less at night (wakes up 2x/night average). She feels overall her urgency is controlled during the day as long as she doesn't hold too long.   Past Medical History: Patient  has a past medical history of Allergy, Arthritis, Asthma, Colon polyps, Depression, Diabetes mellitus without complication ([place]), Diverticulitis, Fatty liver, History of chicken pox, Hyperlipidemia, and Hypertension.   Past Surgical History: She  has a past surgical history that includes Vaginal hysterectomy; Cataract extraction, bilateral; and Vaginal prolapse repair.   Medications: She has a current medication list which includes the following prescription(s): amlodipine, atorvastatin, cyclosporine, fluticasone furoate-vilanterol, freestyle, losartan, semaglutide (1 mg/dose), semaglutide (2 mg/dose), semaglutide(0.25 or 0.'5mg'$ /dos), sertraline, spironolactone, and trospium.   Allergies: Patient is allergic to amoxicillin-pot clavulanate, ciprofloxacin, and metformin.   Social History: Patient  reports that she has quit smoking. She has never used smokeless tobacco. She reports current alcohol use. She reports that she does not use drugs.      OBJECTIVE     Physical Exam: Vitals:   03/24/22 1306  BP: 102/66  Pulse: 68   Gen: No apparent distress, A&O x 3.  Detailed Urogynecologic Evaluation:  Deferred.    CYSTOSCOPY  CYSTOSCOPY: A time out was performed.  The periurethral area was prepped and draped in a sterile manner.  2% lidocaine jetpack was inserted at the urethral meatus and the  urethra and bladder visualized with 0- and 70-degree scopes.  She had normal urethral coaptation and normal urethral mucosa.  She had normal bladder mucosa. She had bilateral clear efflux from both ureteral orifices.  She had no squamous metaplasia at the trigone, no trabeculations, cellules or diverticuli.     ASSESSMENT:  66 y.o. with  urinary urgency with history of sling . Cystoscopy today is normal.    ASSESSMENT AND PLAN    Jessica Snyder is a 66 y.o. with:  1. Overactive bladder   2. Leukocytes in urine    - We discussed alternative options for OAB including PTNS, botox and SNM. She does not feel that her symptoms are bad enough at this time to warrant treatment.  - No concerning findings on cystoscopy today.  - She will return as needed.   Jaquita Folds, MD  Time spent: I spent 15 minutes dedicated to the care of this patient on the date of this encounter to include pre-visit review of records, face-to-face time with the patient discussing treamtent options and post visit documentation in addition to the cystoscopy.

## 2022-03-24 ENCOUNTER — Other Ambulatory Visit (HOSPITAL_COMMUNITY)
Admission: RE | Admit: 2022-03-24 | Discharge: 2022-03-24 | Disposition: A | Discharge status: Home / Self Care | Payer: Medicare HMO | Attending: Obstetrics and Gynecology | Admitting: Obstetrics and Gynecology

## 2022-03-24 ENCOUNTER — Other Ambulatory Visit: Payer: 59 | Admitting: Obstetrics and Gynecology

## 2022-03-24 ENCOUNTER — Encounter: Payer: Self-pay | Admitting: Obstetrics and Gynecology

## 2022-03-24 ENCOUNTER — Ambulatory Visit: Payer: Medicare HMO | Admitting: Obstetrics and Gynecology

## 2022-03-24 VITALS — BP 102/66 | HR 68

## 2022-03-24 DIAGNOSIS — R350 Frequency of micturition: Secondary | ICD-10-CM

## 2022-03-24 DIAGNOSIS — N3281 Overactive bladder: Secondary | ICD-10-CM

## 2022-03-24 DIAGNOSIS — R82998 Other abnormal findings in urine: Secondary | ICD-10-CM

## 2022-03-24 LAB — POCT URINALYSIS DIPSTICK
Bilirubin, UA: NEGATIVE
Blood, UA: NEGATIVE
Glucose, UA: NEGATIVE
Ketones, UA: NEGATIVE
Protein, UA: NEGATIVE
Spec Grav, UA: 1.015 (ref 1.010–1.025)
Urobilinogen, UA: 0.2 E.U./dL
pH, UA: 7 (ref 5.0–8.0)

## 2022-03-24 NOTE — Addendum Note (Signed)
Addended by: Elita Quick on: 03/24/2022 02:28 PM   Modules accepted: Orders

## 2022-03-24 NOTE — Patient Instructions (Signed)
Taking Care of Yourself after Urodynamics, Cystoscopy, Bulkamid Injection, or Botox Injection   Drink plenty of water for a day or two following your procedure. Try to have about 8 ounces (one cup) at a time, and do this 6 times or more per day unless you have fluid restrictitons AVOID irritative beverages such as coffee, tea, soda, alcoholic or citrus drinks for a day or two, as this may cause burning with urination.  For the first 1-2 days after the procedure, your urine may be pink or red in color. You may have some blood in your urine as a normal side effect of the procedure. Large amounts of bleeding or difficulty urinating are NOT normal. Call the nurse line if this happens or go to the nearest Emergency Room if the bleeding is heavy or you cannot urinate at all and it is after hours. If you had a Bulkamid injection in the urethra and need to be catheterized, ask for a pediatric catheter to be used (size 10 or 12-French) so the material is not pushed out of place.   You may experience some discomfort or a burning sensation with urination after having this procedure. You can use over the counter Azo or pyridium to help with burning and follow the instructions on the packaging. If it does not improve within 1-2 days, or other symptoms appear (fever, chills, or difficulty urinating) call the office to speak to a nurse.  You may return to normal daily activities such as work, school, driving, exercising and housework on the day of the procedure. If your doctor gave you a prescription, take it as ordered.     

## 2022-03-25 LAB — URINE CULTURE: Culture: NO GROWTH

## 2022-04-07 DIAGNOSIS — H01022 Squamous blepharitis right lower eyelid: Secondary | ICD-10-CM | POA: Diagnosis not present

## 2022-04-07 DIAGNOSIS — Z7984 Long term (current) use of oral hypoglycemic drugs: Secondary | ICD-10-CM | POA: Diagnosis not present

## 2022-04-07 DIAGNOSIS — E119 Type 2 diabetes mellitus without complications: Secondary | ICD-10-CM | POA: Diagnosis not present

## 2022-04-07 DIAGNOSIS — H04123 Dry eye syndrome of bilateral lacrimal glands: Secondary | ICD-10-CM | POA: Diagnosis not present

## 2022-04-07 DIAGNOSIS — H01025 Squamous blepharitis left lower eyelid: Secondary | ICD-10-CM | POA: Diagnosis not present

## 2022-04-11 DIAGNOSIS — E042 Nontoxic multinodular goiter: Secondary | ICD-10-CM | POA: Diagnosis not present

## 2022-04-11 DIAGNOSIS — I959 Hypotension, unspecified: Secondary | ICD-10-CM | POA: Diagnosis not present

## 2022-04-28 ENCOUNTER — Other Ambulatory Visit: Payer: Self-pay | Admitting: Internal Medicine

## 2022-04-28 DIAGNOSIS — E042 Nontoxic multinodular goiter: Secondary | ICD-10-CM

## 2022-05-24 ENCOUNTER — Other Ambulatory Visit (HOSPITAL_COMMUNITY)
Admission: RE | Admit: 2022-05-24 | Discharge: 2022-05-24 | Disposition: A | Discharge status: Home / Self Care | Payer: Medicare HMO | Source: Ambulatory Visit | Attending: Internal Medicine | Admitting: Internal Medicine

## 2022-05-24 ENCOUNTER — Ambulatory Visit
Admission: RE | Admit: 2022-05-24 | Discharge: 2022-05-24 | Disposition: A | Discharge status: Home / Self Care | Payer: Medicare HMO | Source: Ambulatory Visit | Attending: Internal Medicine | Admitting: Internal Medicine

## 2022-05-24 DIAGNOSIS — E042 Nontoxic multinodular goiter: Secondary | ICD-10-CM | POA: Insufficient documentation

## 2022-05-24 DIAGNOSIS — E041 Nontoxic single thyroid nodule: Secondary | ICD-10-CM | POA: Diagnosis not present

## 2022-05-27 LAB — CYTOLOGY - NON PAP

## 2022-05-31 ENCOUNTER — Encounter: Payer: Medicare HMO | Admitting: Family Medicine

## 2022-05-31 DIAGNOSIS — H16223 Keratoconjunctivitis sicca, not specified as Sjogren's, bilateral: Secondary | ICD-10-CM | POA: Diagnosis not present

## 2022-06-02 ENCOUNTER — Encounter: Payer: Medicare HMO | Admitting: Nurse Practitioner

## 2022-06-02 NOTE — Progress Notes (Deleted)
  Tomasita Morrow, NP-C Phone: [phone]  Jessica Snyder is a 67 y.o. female who presents today for ***  ***  Social History   Tobacco Use  Smoking Status Former  Smokeless Tobacco Never  Tobacco Comments   quit age 57 y.o duration 12-15 years 1 ppd     Current Outpatient Medications on File Prior to Visit  Medication Sig Dispense Refill   amLODipine (NORVASC) 10 MG tablet Take 1 tablet (10 mg total) by mouth daily. 90 tablet 3   atorvastatin (LIPITOR) 20 MG tablet Qhs 90 tablet 3   cycloSPORINE (RESTASIS) 0.05 % ophthalmic emulsion 1 drop 2 (two) times daily.     fluticasone furoate-vilanterol (BREO ELLIPTA) 200-25 MCG/ACT AEPB INHALE 1 PUFF INTO THE LUNGS DAILY. RINSE MOUTH WITH USE 180 each 3   Lancets (FREESTYLE) lancets Bid Use as instructed e11.9 200 each 3   losartan (COZAAR) 100 MG tablet Take 1 tablet (100 mg total) by mouth daily. 90 tablet 3   Semaglutide, 2 MG/DOSE, 8 MG/3ML SOPN Inject 2 mg as directed once a week. If tolerated d/c [employer] will not cover 3 mL 5   sertraline (ZOLOFT) 100 MG tablet Take 1 tablet (100 mg total) by mouth daily. 90 tablet 3   spironolactone (ALDACTONE) 25 MG tablet Take 1 tablet (25 mg total) by mouth daily. In am D/c hctz 25 and potassium 90 tablet 3   trospium (SANCTURA) 20 MG tablet Take 1 tablet (20 mg total) by mouth 2 (two) times daily. 60 tablet 5   No current facility-administered medications on file prior to visit.     ROS see history of present illness  Objective  Physical Exam There were no vitals filed for this visit.  BP Readings from Last 3 Encounters:  03/24/22 102/66  11/25/21 126/78  11/19/21 112/76   Wt Readings from Last 3 Encounters:  11/25/21 166 lb (75.3 kg)  11/19/21 166 lb (75.3 kg)  05/28/21 189 lb 9.6 oz (86 kg)    Physical Exam   Assessment/Plan: Please see individual problem list.  Primary hypertension  Type 2 diabetes mellitus without complication, without long-term current use of  insulin (HCC)  Hyperlipidemia, unspecified hyperlipidemia type  Screening mammogram for breast cancer     Health Maintenance: ***  No follow-ups on file.   Tomasita Morrow, NP-C [place]

## 2022-06-03 NOTE — Progress Notes (Signed)
This encounter was created in error - please disregard.

## 2022-07-22 ENCOUNTER — Encounter: Payer: Medicare HMO | Admitting: Nurse Practitioner

## 2022-08-01 DIAGNOSIS — J4530 Mild persistent asthma, uncomplicated: Secondary | ICD-10-CM | POA: Diagnosis not present

## 2022-08-01 DIAGNOSIS — E119 Type 2 diabetes mellitus without complications: Secondary | ICD-10-CM | POA: Diagnosis not present

## 2022-08-01 DIAGNOSIS — R69 Illness, unspecified: Secondary | ICD-10-CM | POA: Diagnosis not present

## 2022-08-01 DIAGNOSIS — I10 Essential (primary) hypertension: Secondary | ICD-10-CM | POA: Diagnosis not present

## 2022-08-01 DIAGNOSIS — E782 Mixed hyperlipidemia: Secondary | ICD-10-CM | POA: Diagnosis not present

## 2022-08-01 DIAGNOSIS — Z1231 Encounter for screening mammogram for malignant neoplasm of breast: Secondary | ICD-10-CM | POA: Diagnosis not present

## 2022-08-02 ENCOUNTER — Other Ambulatory Visit: Payer: Self-pay | Admitting: Family Medicine

## 2022-08-02 DIAGNOSIS — Z1231 Encounter for screening mammogram for malignant neoplasm of breast: Secondary | ICD-10-CM

## 2022-08-16 DIAGNOSIS — H16223 Keratoconjunctivitis sicca, not specified as Sjogren's, bilateral: Secondary | ICD-10-CM | POA: Diagnosis not present

## 2022-08-23 DIAGNOSIS — E119 Type 2 diabetes mellitus without complications: Secondary | ICD-10-CM | POA: Diagnosis not present

## 2022-08-25 ENCOUNTER — Ambulatory Visit
Admission: RE | Admit: 2022-08-25 | Discharge: 2022-08-25 | Disposition: A | Discharge status: Home / Self Care | Payer: Medicare HMO | Source: Ambulatory Visit | Attending: Family Medicine | Admitting: Family Medicine

## 2022-08-25 DIAGNOSIS — Z1231 Encounter for screening mammogram for malignant neoplasm of breast: Secondary | ICD-10-CM | POA: Diagnosis not present

## 2022-08-30 DIAGNOSIS — J4530 Mild persistent asthma, uncomplicated: Secondary | ICD-10-CM | POA: Diagnosis not present

## 2022-08-30 DIAGNOSIS — E119 Type 2 diabetes mellitus without complications: Secondary | ICD-10-CM | POA: Diagnosis not present

## 2022-08-30 DIAGNOSIS — E782 Mixed hyperlipidemia: Secondary | ICD-10-CM | POA: Diagnosis not present

## 2022-08-30 DIAGNOSIS — F341 Dysthymic disorder: Secondary | ICD-10-CM | POA: Diagnosis not present

## 2022-08-30 DIAGNOSIS — Z0000 Encounter for general adult medical examination without abnormal findings: Secondary | ICD-10-CM | POA: Diagnosis not present

## 2022-08-30 DIAGNOSIS — E041 Nontoxic single thyroid nodule: Secondary | ICD-10-CM | POA: Diagnosis not present

## 2022-08-30 DIAGNOSIS — I10 Essential (primary) hypertension: Secondary | ICD-10-CM | POA: Diagnosis not present

## 2022-08-30 DIAGNOSIS — Z1231 Encounter for screening mammogram for malignant neoplasm of breast: Secondary | ICD-10-CM | POA: Diagnosis not present

## 2022-10-11 DIAGNOSIS — H0288A Meibomian gland dysfunction right eye, upper and lower eyelids: Secondary | ICD-10-CM | POA: Diagnosis not present

## 2022-10-11 DIAGNOSIS — H0288B Meibomian gland dysfunction left eye, upper and lower eyelids: Secondary | ICD-10-CM | POA: Diagnosis not present

## 2022-10-11 DIAGNOSIS — H16223 Keratoconjunctivitis sicca, not specified as Sjogren's, bilateral: Secondary | ICD-10-CM | POA: Diagnosis not present

## 2022-12-13 DIAGNOSIS — H16223 Keratoconjunctivitis sicca, not specified as Sjogren's, bilateral: Secondary | ICD-10-CM | POA: Diagnosis not present

## 2022-12-13 DIAGNOSIS — H0288A Meibomian gland dysfunction right eye, upper and lower eyelids: Secondary | ICD-10-CM | POA: Diagnosis not present

## 2022-12-13 DIAGNOSIS — H0288B Meibomian gland dysfunction left eye, upper and lower eyelids: Secondary | ICD-10-CM | POA: Diagnosis not present

## 2023-02-07 DIAGNOSIS — H0288A Meibomian gland dysfunction right eye, upper and lower eyelids: Secondary | ICD-10-CM | POA: Diagnosis not present

## 2023-02-07 DIAGNOSIS — H0288B Meibomian gland dysfunction left eye, upper and lower eyelids: Secondary | ICD-10-CM | POA: Diagnosis not present

## 2023-02-07 DIAGNOSIS — Z0100 Encounter for examination of eyes and vision without abnormal findings: Secondary | ICD-10-CM | POA: Diagnosis not present

## 2023-02-07 DIAGNOSIS — H524 Presbyopia: Secondary | ICD-10-CM | POA: Diagnosis not present

## 2023-02-07 DIAGNOSIS — H16223 Keratoconjunctivitis sicca, not specified as Sjogren's, bilateral: Secondary | ICD-10-CM | POA: Diagnosis not present

## 2023-02-07 DIAGNOSIS — H35363 Drusen (degenerative) of macula, bilateral: Secondary | ICD-10-CM | POA: Diagnosis not present

## 2023-02-07 DIAGNOSIS — I10 Essential (primary) hypertension: Secondary | ICD-10-CM | POA: Diagnosis not present

## 2023-02-07 DIAGNOSIS — E119 Type 2 diabetes mellitus without complications: Secondary | ICD-10-CM | POA: Diagnosis not present

## 2023-02-07 DIAGNOSIS — H35033 Hypertensive retinopathy, bilateral: Secondary | ICD-10-CM | POA: Diagnosis not present

## 2023-02-07 DIAGNOSIS — H26493 Other secondary cataract, bilateral: Secondary | ICD-10-CM | POA: Diagnosis not present

## 2023-03-01 DIAGNOSIS — I10 Essential (primary) hypertension: Secondary | ICD-10-CM | POA: Diagnosis not present

## 2023-03-01 DIAGNOSIS — Z1329 Encounter for screening for other suspected endocrine disorder: Secondary | ICD-10-CM | POA: Diagnosis not present

## 2023-03-01 DIAGNOSIS — F341 Dysthymic disorder: Secondary | ICD-10-CM | POA: Diagnosis not present

## 2023-03-01 DIAGNOSIS — E119 Type 2 diabetes mellitus without complications: Secondary | ICD-10-CM | POA: Diagnosis not present

## 2023-03-01 DIAGNOSIS — E782 Mixed hyperlipidemia: Secondary | ICD-10-CM | POA: Diagnosis not present

## 2023-04-14 ENCOUNTER — Other Ambulatory Visit: Payer: Self-pay | Admitting: Internal Medicine

## 2023-04-14 DIAGNOSIS — E042 Nontoxic multinodular goiter: Secondary | ICD-10-CM

## 2023-04-19 ENCOUNTER — Inpatient Hospital Stay
Admission: RE | Admit: 2023-04-19 | Discharge: 2023-04-19 | Discharge status: Home / Self Care | Payer: Medicare HMO | Source: Ambulatory Visit | Attending: Internal Medicine | Admitting: Internal Medicine

## 2023-04-19 DIAGNOSIS — E042 Nontoxic multinodular goiter: Secondary | ICD-10-CM

## 2023-09-05 ENCOUNTER — Other Ambulatory Visit: Payer: Self-pay | Admitting: Family Medicine

## 2023-09-05 DIAGNOSIS — Z1231 Encounter for screening mammogram for malignant neoplasm of breast: Secondary | ICD-10-CM

## 2023-09-11 ENCOUNTER — Ambulatory Visit
Admission: RE | Admit: 2023-09-11 | Discharge: 2023-09-11 | Disposition: A | Discharge status: Home / Self Care | Source: Ambulatory Visit | Attending: Family Medicine | Admitting: Family Medicine

## 2023-09-11 DIAGNOSIS — Z1231 Encounter for screening mammogram for malignant neoplasm of breast: Secondary | ICD-10-CM

## 2024-03-31 ENCOUNTER — Other Ambulatory Visit: Payer: Self-pay

## 2024-03-31 ENCOUNTER — Emergency Department
Admission: EM | Admit: 2024-03-31 | Discharge: 2024-03-31 | Disposition: A | Discharge status: Home / Self Care | Attending: Emergency Medicine | Admitting: Emergency Medicine

## 2024-03-31 ENCOUNTER — Emergency Department

## 2024-03-31 DIAGNOSIS — E119 Type 2 diabetes mellitus without complications: Secondary | ICD-10-CM | POA: Insufficient documentation

## 2024-03-31 DIAGNOSIS — Z79899 Other long term (current) drug therapy: Secondary | ICD-10-CM | POA: Insufficient documentation

## 2024-03-31 DIAGNOSIS — K5792 Diverticulitis of intestine, part unspecified, without perforation or abscess without bleeding: Secondary | ICD-10-CM | POA: Insufficient documentation

## 2024-03-31 DIAGNOSIS — D72829 Elevated white blood cell count, unspecified: Secondary | ICD-10-CM | POA: Insufficient documentation

## 2024-03-31 DIAGNOSIS — R1032 Left lower quadrant pain: Secondary | ICD-10-CM | POA: Diagnosis present

## 2024-03-31 DIAGNOSIS — I10 Essential (primary) hypertension: Secondary | ICD-10-CM | POA: Insufficient documentation

## 2024-03-31 LAB — CBC WITH DIFFERENTIAL/PLATELET
Abs Immature Granulocytes: 0.03 K/uL (ref 0.00–0.07)
Basophils Absolute: 0.1 K/uL (ref 0.0–0.1)
Basophils Relative: 0 %
Eosinophils Absolute: 0.1 K/uL (ref 0.0–0.5)
Eosinophils Relative: 1 %
HCT: 40.4 % (ref 36.0–46.0)
Hemoglobin: 13.3 g/dL (ref 12.0–15.0)
Immature Granulocytes: 0 %
Lymphocytes Relative: 22 %
Lymphs Abs: 2.5 K/uL (ref 0.7–4.0)
MCH: 31.9 pg (ref 26.0–34.0)
MCHC: 32.9 g/dL (ref 30.0–36.0)
MCV: 96.9 fL (ref 80.0–100.0)
Monocytes Absolute: 0.7 K/uL (ref 0.1–1.0)
Monocytes Relative: 7 %
Neutro Abs: 7.8 K/uL — ABNORMAL HIGH (ref 1.7–7.7)
Neutrophils Relative %: 70 %
Platelets: 274 K/uL (ref 150–400)
RBC: 4.17 MIL/uL (ref 3.87–5.11)
RDW: 11.9 % (ref 11.5–15.5)
WBC: 11.1 K/uL — ABNORMAL HIGH (ref 4.0–10.5)
nRBC: 0 % (ref 0.0–0.2)

## 2024-03-31 LAB — BASIC METABOLIC PANEL WITH GFR
Anion gap: 11 (ref 5–15)
BUN: 15 mg/dL (ref 8–23)
CO2: 26 mmol/L (ref 22–32)
Calcium: 9.2 mg/dL (ref 8.9–10.3)
Chloride: 104 mmol/L (ref 98–111)
Creatinine, Ser: 0.85 mg/dL (ref 0.44–1.00)
GFR, Estimated: >60 mL/min (ref >60)
Glucose, Bld: 131 mg/dL — ABNORMAL HIGH (ref 70–99)
Potassium: 4.4 mmol/L (ref 3.5–5.1)
Sodium: 141 mmol/L (ref 135–145)

## 2024-03-31 LAB — URINALYSIS, ROUTINE W REFLEX MICROSCOPIC
Bacteria, UA: NONE SEEN
Bilirubin Urine: NEGATIVE
Glucose, UA: NEGATIVE mg/dL
Ketones, ur: NEGATIVE mg/dL
Nitrite: NEGATIVE
Protein, ur: NEGATIVE mg/dL
Specific Gravity, Urine: 1.005 (ref 1.005–1.030)
pH: 5 (ref 5.0–8.0)

## 2024-03-31 MED ORDER — ONDANSETRON 4 MG PO TBDP: 4.0000 mg | ORAL_TABLET | Freq: Three times a day (TID) | ORAL | 0 refills | Status: AC | PRN

## 2024-03-31 MED ORDER — METRONIDAZOLE 500 MG PO TABS: 500.0000 mg | ORAL_TABLET | Freq: Three times a day (TID) | ORAL | 0 refills | Status: AC

## 2024-03-31 MED ORDER — SULFAMETHOXAZOLE-TRIMETHOPRIM 800-160 MG PO TABS: 1.0000 | ORAL_TABLET | Freq: Two times a day (BID) | ORAL | 0 refills | Status: DC

## 2024-03-31 MED ORDER — ONDANSETRON 4 MG PO TBDP: 4.0000 mg | ORAL_TABLET | Freq: Three times a day (TID) | ORAL | 0 refills | Status: DC | PRN

## 2024-03-31 MED ORDER — METRONIDAZOLE 500 MG PO TABS
500.0000 mg | ORAL_TABLET | Freq: Once | ORAL | Status: AC
  Administered 2024-03-31: 500 mg via ORAL
  Filled 2024-03-31: qty 1

## 2024-03-31 MED ORDER — SULFAMETHOXAZOLE-TRIMETHOPRIM 800-160 MG PO TABS: 1.0000 | ORAL_TABLET | Freq: Two times a day (BID) | ORAL | 0 refills | Status: AC

## 2024-03-31 MED ORDER — METRONIDAZOLE 500 MG PO TABS: 500.0000 mg | ORAL_TABLET | Freq: Three times a day (TID) | ORAL | 0 refills | Status: DC

## 2024-03-31 MED ORDER — SULFAMETHOXAZOLE-TRIMETHOPRIM 800-160 MG PO TABS
1.0000 | ORAL_TABLET | Freq: Once | ORAL | Status: AC
Start: 2024-03-31 — End: 2024-03-31
  Administered 2024-03-31: 1 via ORAL
  Filled 2024-03-31: qty 1

## 2024-03-31 NOTE — ED Provider Notes (Signed)
 [facility] Provider Note    Event Date/Time   First MD Initiated Contact with Patient 03/31/24 2257     (approximate)   History   Flank Pain   HPI  Jessica Snyder is a 68 y.o. female who comes in with left-sided flank pain that started approximately hour prior to arrival home.  Patient reports that she is actually had 2 days of left lower quadrant pain that has been gradually worsening and that it actually got acutely more worse before coming in which made her percent.  She does report having history of diverticulitis.  The pain is in her lower abdomen denies any chest pain, shortness of breath, nausea, vomiting, diarrhea, blood in her stool or any other concerns.  No urinary symptoms.  I reviewed a note from 02/21/2024 where patient has a history of diabetes, hypertension.   Physical Exam   Triage Vital Signs: ED Triage Vitals  Encounter Vitals Group     BP 03/31/24 2201 128/79     Girls Systolic BP Percentile --      Girls Diastolic BP Percentile --      Boys Systolic BP Percentile --      Boys Diastolic BP Percentile --      Pulse Rate 03/31/24 2201 95     Resp 03/31/24 2201 18     Temp 03/31/24 2201 98.2 F (36.8 C)     Temp src --      SpO2 03/31/24 2201 98 %     Weight 03/31/24 2200 135 lb (61.2 kg)     Height 03/31/24 2200 5' 2 (1.575 m)     Head Circumference --      Peak Flow --      Pain Score 03/31/24 2200 8     Pain Loc --      Pain Education --      Exclude from Growth Chart --     Most recent vital signs: Vitals:   03/31/24 2201  BP: 128/79  Pulse: 95  Resp: 18  Temp: 98.2 F (36.8 C)  SpO2: 98%     General: Awake, no distress.  CV:  Good peripheral perfusion.  Resp:  Normal effort.  Abd:  No distention.  Slight tenderness in the left lower quadrant without any rebound, guarding Other:     ED Results / Procedures / Treatments   Labs (all labs ordered are listed, but only abnormal results are displayed) Labs  Reviewed  CBC WITH DIFFERENTIAL/PLATELET - Abnormal; Notable for the following components:      Result Value   WBC 11.1 (*)    Neutro Abs 7.8 (*)    All other components within normal limits  BASIC METABOLIC PANEL WITH GFR - Abnormal; Notable for the following components:   Glucose, Bld 131 (*)    All other components within normal limits  URINALYSIS, ROUTINE W REFLEX MICROSCOPIC     RADIOLOGY I have reviewed the ct personally and interpreted no evidence of kidney stones   PROCEDURES:  Critical Care performed: No  Procedures   MEDICATIONS ORDERED IN ED: Medications - No data to display   IMPRESSION / MDM / ASSESSMENT AND PLAN / ED COURSE  I reviewed the triage vital signs and the nursing notes.   Patient's presentation is most consistent with acute presentation with potential threat to life or bodily function.   Differential is diverticulitis, obstruction, kidney stone.  BMP was reassuring CBC shows slightly elevated white count  IMPRESSION: 1. Mild  sigmoid diverticulitis without perforation or abscess.  Pt declined UA denies any urinary symptoms   Considering adding on hepatic function and lipase but patient has already been here for over an hour and she denies any upper abdominal pain and CT scan is consistent with diverticulitis which was where her known pain is at.  Patient reports having a flight tomorrow and felt comfortable without having these blood work added on given she denies any pain in her upper abdomen.  She has got no upper abdominal pain or chest pain to suggest ACS equivalent.  Her exam seems to be consistent with known diverticulitis we discussed limitation of contrast but CT scan was ordered from triage and given the CT finding of diverticulitis this seems most consistent with her symptoms.  Given her allergies we discussed doing Bactrim , Flagyl  as she is tolerated this previously we discussed liquid diet and low fiber foods to start and we discussed  following up for routine colonoscopy.  Patient reports that she is already seeing surgical team before given she has had recurrent episodes but she has not had one in years and declines follow-up today.  She declines any oxycodone for pain.  We discussed that I cannot predict the future and that if she develops worsening pain fevers or worsening symptoms she should return to the ER for repeat evaluation as this can be a sign of something more progressing.   The patient is on the cardiac monitor to evaluate for evidence of arrhythmia and/or significant heart rate changes.      FINAL CLINICAL IMPRESSION(S) / ED DIAGNOSES   Final diagnoses:  Diverticulitis     Rx / DC Orders   ED Discharge Orders          Ordered    sulfamethoxazole -trimethoprim  (BACTRIM  DS) 800-160 MG tablet  2 times daily        03/31/24 2315    metroNIDAZOLE  (FLAGYL ) 500 MG tablet  3 times daily        03/31/24 2315    ondansetron  (ZOFRAN -ODT) 4 MG disintegrating tablet  Every 8 hours PRN        03/31/24 2315             Note:  This document was prepared using Dragon voice recognition software and may include unintentional dictation errors.   Ernest Ronal BRAVO, MD 03/31/24 [phone]

## 2024-03-31 NOTE — Discharge Instructions (Addendum)
 Liquid diet and bowel rest (low fiber foods) are most important  Take Tylenol 1 g every 8 hours with ibuprofen 600 every 8 hours with food for up to 1 week to help with pain.  IMPRESSION: 1. Mild sigmoid diverticulitis without perforation or abscess.  Return to the ER for fevers, worsening abdominal pain, worsening symptoms or any other concerns.

## 2024-03-31 NOTE — ED Triage Notes (Signed)
 Pt reports left side flank pain that began aprox 1 hour ago, pt denies hx kidney stones. Denies dysuria.

## 2024-04-15 ENCOUNTER — Other Ambulatory Visit: Payer: Self-pay | Admitting: Internal Medicine

## 2024-04-15 DIAGNOSIS — E042 Nontoxic multinodular goiter: Secondary | ICD-10-CM

## 2024-05-07 ENCOUNTER — Ambulatory Visit
Admission: RE | Admit: 2024-05-07 | Discharge: 2024-05-07 | Disposition: A | Discharge status: Home / Self Care | Source: Ambulatory Visit | Attending: Internal Medicine | Admitting: Internal Medicine

## 2024-05-07 DIAGNOSIS — E042 Nontoxic multinodular goiter: Secondary | ICD-10-CM

## 2024-06-07 ENCOUNTER — Other Ambulatory Visit: Payer: Self-pay | Admitting: Nurse Practitioner

## 2024-06-07 DIAGNOSIS — M79652 Pain in left thigh: Secondary | ICD-10-CM

## 2024-06-12 ENCOUNTER — Ambulatory Visit
Admission: RE | Admit: 2024-06-12 | Discharge: 2024-06-12 | Attending: Nurse Practitioner | Admitting: Nurse Practitioner

## 2024-06-12 DIAGNOSIS — M79652 Pain in left thigh: Secondary | ICD-10-CM
